# Patient Record
Sex: Male | Born: 1935 | Race: White | Hispanic: No | Smoking: Never smoker
Health system: Southern US, Community
[De-identification: ages and names within clinical notes are randomized; demographics above are authoritative.]

## PROBLEM LIST (undated history)

## (undated) DIAGNOSIS — E785 Hyperlipidemia, unspecified: Secondary | ICD-10-CM

## (undated) DIAGNOSIS — N189 Chronic kidney disease, unspecified: Secondary | ICD-10-CM

## (undated) DIAGNOSIS — F0280 Dementia in other diseases classified elsewhere without behavioral disturbance: Secondary | ICD-10-CM

## (undated) DIAGNOSIS — I10 Essential (primary) hypertension: Secondary | ICD-10-CM

## (undated) DIAGNOSIS — S72009A Fracture of unspecified part of neck of unspecified femur, initial encounter for closed fracture: Secondary | ICD-10-CM

## (undated) DIAGNOSIS — G459 Transient cerebral ischemic attack, unspecified: Secondary | ICD-10-CM

## (undated) DIAGNOSIS — G3183 Dementia with Lewy bodies: Secondary | ICD-10-CM

## (undated) DIAGNOSIS — G20 Parkinson's disease: Secondary | ICD-10-CM

## (undated) HISTORY — PX: OTHER SURGICAL HISTORY: SHX169

---

## 2004-08-06 ENCOUNTER — Ambulatory Visit: Payer: Self-pay | Admitting: Family Medicine

## 2004-12-03 ENCOUNTER — Ambulatory Visit: Payer: Self-pay | Admitting: General Surgery

## 2007-05-12 ENCOUNTER — Encounter: Admission: RE | Admit: 2007-05-12 | Discharge: 2007-08-18 | Payer: Self-pay | Admitting: Neurology

## 2007-09-05 ENCOUNTER — Encounter: Admission: RE | Admit: 2007-09-05 | Discharge: 2007-09-05 | Payer: Self-pay | Admitting: Neurology

## 2008-09-17 ENCOUNTER — Emergency Department: Payer: Self-pay | Admitting: Unknown Physician Specialty

## 2009-08-08 ENCOUNTER — Ambulatory Visit: Payer: Self-pay | Admitting: Internal Medicine

## 2009-08-08 DIAGNOSIS — E785 Hyperlipidemia, unspecified: Secondary | ICD-10-CM

## 2009-08-08 DIAGNOSIS — I10 Essential (primary) hypertension: Secondary | ICD-10-CM | POA: Insufficient documentation

## 2009-08-08 DIAGNOSIS — R05 Cough: Secondary | ICD-10-CM

## 2009-08-09 ENCOUNTER — Telehealth (INDEPENDENT_AMBULATORY_CARE_PROVIDER_SITE_OTHER): Payer: Self-pay | Admitting: *Deleted

## 2009-08-28 ENCOUNTER — Telehealth (INDEPENDENT_AMBULATORY_CARE_PROVIDER_SITE_OTHER): Payer: Self-pay | Admitting: *Deleted

## 2009-08-29 ENCOUNTER — Ambulatory Visit: Payer: Self-pay | Admitting: Internal Medicine

## 2009-09-29 ENCOUNTER — Ambulatory Visit: Payer: Self-pay | Admitting: Internal Medicine

## 2009-11-07 ENCOUNTER — Telehealth (INDEPENDENT_AMBULATORY_CARE_PROVIDER_SITE_OTHER): Payer: Self-pay | Admitting: *Deleted

## 2009-11-29 ENCOUNTER — Ambulatory Visit: Payer: Self-pay | Admitting: Internal Medicine

## 2009-11-29 DIAGNOSIS — J449 Chronic obstructive pulmonary disease, unspecified: Secondary | ICD-10-CM

## 2009-11-29 DIAGNOSIS — J4489 Other specified chronic obstructive pulmonary disease: Secondary | ICD-10-CM | POA: Insufficient documentation

## 2010-02-21 NOTE — Progress Notes (Signed)
Summary: samples  Phone Note Call from Patient   Caller: Patient Call For: [facility] Summary of Call: pt had to rsc appt for pft/ rov because of bump list. he will need 3 more "containers" of bystolic (samples) before then. call [phone]. Initial call taken by: Tivis Ringer, CNA,  November 07, 2009 10:15 AM  Follow-up for Phone Call        3 boxes of bystolic 20mg  left out front for pt due to [place] moving his appt--lot # [id number]  exp-11/2010--pt aware and will p/u in about a week Follow-up by: Philipp Deputy CMA,  November 07, 2009 11:24 AM

## 2010-02-21 NOTE — Assessment & Plan Note (Signed)
Summary: Pulmonary/ ext f/u eval uacs/ bp ok off ace   Visit Type:  Follow-up Copy to:  Dr. Lorie Phenix Primary Provider/Referring Provider:  Dr. Lorie Phenix  CC:  Patient is here for follow-up...pt says his cough is gone but still wheezing and light-headed at times.  History of Present Illness: 77 yowm quit smoking 06/20/67 because friend died from lung cancer but pt had no symptoms  August 08, 2009  1st pulmonary office eval for noisy breathing comes and goes for years (maybe since the age of 54 or so) worse x 6 months  with minimal assoc coughing minimal prod yellow mucus production but no effect on ex tol which  is quite good. no noct disturbances.  rec try off lisnopril > no better  August 29, 2009 Followup.  Pt states still wheezing "comes and goes"- notices most first thing in the am. Pt states that he feels dizzy shortly after taking benicar.  took whole pill this am. no limiting sob, cough or noct or am exac.  stop metaprolol Stop benicar Stop fish oil  Start bystolic 20 mg one daily x 4 weeks trial Prilosec start taking it one daily 30 min before bfast  PFT's  September 29, 2009 Patient is here for follow-up...pt says his cough is gone but still wheezing and light-headed at times.  wokrs in the yard in heat , walks 1.5 miles on a treadmill daily without problems. was having dizzy spells before med changes, no worse now, not reproducible with position or activity, usually just last a second or two.  Pt denies any significant sore throat, dysphagia, itching, sneezing,  nasal congestion or excess secretions,  fever, chills, sweats, unintended wt loss, pleuritic or exertional cp, hempoptysis, change in activity tolerance  orthopnea pnd or leg swelling. Pt also denies any obvious fluctuation in symptoms with weather or environmental change or other alleviating or aggravating factors.       Current Medications (verified): 1)  Simvastatin 20 Mg Tabs (Simvastatin) .Marland Kitchen.. 1 Once Daily 2)   Juice Plus Fibre  Liqd (Nutritional Supplements) .... As Directed 3)  Aspirin 81 Mg Tbec (Aspirin) .Marland Kitchen.. 1 Once Daily 4)  Prilosec Otc 20 Mg Tbec (Omeprazole Magnesium) .Marland Kitchen.. 1 By Mouth Daily 5)  Multivitamins  Tabs (Multiple Vitamin) .Marland Kitchen.. 1 Once Daily 6)  Bystolic 20 Mg Tabs (Nebivolol Hcl) .... One Daily  Allergies (verified): No Known Drug Allergies  Past History:  Past Medical History: Hyperlipidemia Hypertension     - Off ace August 08, 2009 due to "noisy breathing" > no better August 29, 2009 > resolved September 29, 2009      - Try change to bystolic August 29, 2009   Vital Signs:  Patient profile:   77 year old male Height:      69 inches (175.26 cm) Weight:      210 pounds (95.45 kg) BMI:     31.12 O2 Sat:      94 % on Room air Temp:     98.2 degrees F (36.78 degrees C) oral Pulse rate:   60 / minute BP sitting:   140 / 82  (left arm) BP standing:   122 / 78  (left arm) Cuff size:   regular  Vitals Entered By: Michel Bickers CMA (September 29, 2009 2:06 PM)  O2 Sat at Rest %:  94 O2 Flow:  Room air CC: Patient is here for follow-up...pt says his cough is gone but still wheezing and  light-headed at times Comments Medications reviewed with the patient. Daytime phone verified. Michel Bickers [facility]  September 29, 2009 2:07 PM   Physical Exam  Additional Exam:  amb wm nad no longer with classic  pseudowheeze  wt 210 August 08, 2009>  208 August 29, 2009 > 210 September 29, 2009  HEENT: nl dentition, turbinates, and orophanx. Nl external ear canals without cough reflex NECK :  without JVD/Nodes/TM/ nl carotid upstrokes bilaterally LUNGS: no acc muscle use, clear to A and P bilaterally without cough on insp or exp maneuvers CV:  RRR  no s3 or murmur or increase in P2, no edema   ABD:  soft and nontender with nl excursion in the supine position. No bruits or organomegaly, bowel sounds nl MS:  warm without deformities, calf tenderness, cyanosis or clubbing     Clinical Reports  Reviewed:  CXR:  08/08/2009: CXR Results:  Comparison: None   Findings: Cardiomediastinal silhouette is within normal limits. The lungs are free of focal consolidations and pleural effusions. There is minimal atelectasis at the right lung base. Bony structures have a normal appearance.   IMPRESSION: No evidence for acute cardiopulmonary abnormality.   Impression & Recommendations:  Problem # 1:  COUGH (ICD-786.2)  Resolved upper airway cough syndrome - needs baseline pft's to exclude copd / asthma before making a final rec re Beta blocker use  Orders: Est. Patient Level IV (62130)  Problem # 2:  HYPERTENSION, BENIGN (ICD-401.1)  His updated medication list for this problem includes:    Bystolic 20 Mg Tabs (Nebivolol hcl) ..... One daily  ok on Bystolic, the most beta -1  selective Beta blocker available in sample form, with bisoprolol the most selective generic choice  on the market. doubt this is cause of dizzy spells ? significant, no correlation with his bp per his diary ? does he need holter? defer to Dr Elease Hashimoto  Orders: Est. Patient Level IV (86578)  Medications Added to Medication List This Visit: 1)  Prilosec Otc 20 Mg Tbec (Omeprazole magnesium) .Marland Kitchen.. 1 by mouth daily 2)  Prilosec Otc 20 Mg Tbec (Omeprazole magnesium) .... Take  one 30-60 min before first meal of the day  Patient Instructions: 1)  Please schedule a follow-up appointment in 6 weeks, sooner if needed with PFT's on return

## 2010-02-21 NOTE — Assessment & Plan Note (Signed)
Summary: Pulmonary/  f/u change to bystolic 20    Copy to:  Dr. Lorie Phenix Primary Provider/Referring Provider:  Dr. Lorie Phenix  CC:  Followup.  Pt states still wheezing "comes and goes"- notices most first thing in the am. Pt states that he feels dizzy shortly after taking benicar. Marland Kitchen  History of Present Illness: 70 yowm quit smoking Jun 11, 1967 because friend died from lung cancer but pt had no symptoms  August 08, 2009  1st pulmonary office eval for noisy breathing comes and goes for years (maybe since the age of 63 or so) worse x 6 months  with minimal assoc coughing minimal prod yellow mucus production but no effect on ex tol whichi is quite good. no noct disturbances.  rec try off lisnopril > no better  August 29, 2009 Followup.  Pt states still wheezing "comes and goes"- notices most first thing in the am. Pt states that he feels dizzy shortly after taking benicar.  took whole pill this am. no limiting sob, cough or noct or am exac.  Pt denies any significant sore throat, dysphagia, itching, sneezing,  nasal congestion or excess secretions,  fever, chills, sweats, unintended wt loss, pleuritic or exertional cp, hempoptysis, change in activity tolerance  orthopnea pnd or leg swelling   Current Medications (verified): 1)  Metoprolol Tartrate 50 Mg Tabs (Metoprolol Tartrate) .Marland Kitchen.. 1 Once Daily 2)  Simvastatin 20 Mg Tabs (Simvastatin) .Marland Kitchen.. 1 Once Daily 3)  Juice Plus Fibre  Liqd (Nutritional Supplements) .... As Directed 4)  Aspirin 81 Mg Tbec (Aspirin) .Marland Kitchen.. 1 Once Daily 5)  Fish Oil 1200 Mg Caps (Omega-3 Fatty Acids) .Marland Kitchen.. 1 Once Daily 6)  Prilosec Otc 20 Mg Tbec (Omeprazole Magnesium) .Marland Kitchen.. 1 Every Other Day 7)  Multivitamins  Tabs (Multiple Vitamin) .Marland Kitchen.. 1 Once Daily 8)  Benicar 20 Mg  Tabs (Olmesartan Medoxomil) .... One Tablet By Mouth Daily  Allergies (verified): No Known Drug Allergies  Past History:  Past Medical History: Hyperlipidemia Hypertension     - Off ace August 08, 2009  due to "noisy breathing" > no better August 29, 2009      - Try change to bystolic August 29, 2009   Vital Signs:  Patient profile:   77 year old male Weight:      208 pounds O2 Sat:      97 % on Room air Temp:     97.6 degrees F oral Pulse rate:   59 / minute BP sitting:   144 / 86  (left arm)  Vitals Entered By: Vernie Murders (August 29, 2009 10:23 AM)  O2 Flow:  Room air  Physical Exam  Additional Exam:  amb wm nad with classic mild pseudowheeze resolves with purse lip maneuver  wt 210 August 08, 2009 . 208 August 29, 2009  HEENT: nl dentition, turbinates, and orophanx. Nl external ear canals without cough reflex NECK :  without JVD/Nodes/TM/ nl carotid upstrokes bilaterally LUNGS: no acc muscle use, clear to A and P bilaterally without cough on insp or exp maneuvers CV:  RRR  no s3 or murmur or increase in P2, no edema   ABD:  soft and nontender with nl excursion in the supine position. No bruits or organomegaly, bowel sounds nl MS:  warm without deformities, calf tenderness, cyanosis or clubbing     Clinical Reports Reviewed:  CXR:  08/08/2009: CXR Results:  Comparison: None   Findings: Cardiomediastinal silhouette is within normal limits. The lungs are free  of focal consolidations and pleural effusions. There is minimal atelectasis at the right lung base. Bony structures have a normal appearance.   IMPRESSION: No evidence for acute cardiopulmonary abnormality.   Impression & Recommendations:  Problem # 1:  COUGH (ICD-786.2)   Classic Upper airway cough syndrome, so named because it's frequently impossible to sort out how much is  CR/sinusitis with freq throat clearing (which can be related to primary GERD)   vs  causing  secondary extra esophageal GERD from wide swings in gastric pressure that occur with throat clearing, promoting self use of mint and menthol lozenges that reduce the lower esophageal sphincter tone and exacerbate the problem further These are the  same pts who not infrequently have failed to tolerate ace inhibitors,  dry powder inhalers or biphosphonates or report having reflux symptoms that don't respond to standard doses of PPI  Try maintain off ACE and on ppi/diet plus trial of Bystolic, the most beta -1  selective Beta blocker available in sample form, with bisoprolol the most selective generic choice  on the market.    Each maintenance medication was reviewed in detail including most importantly the difference between maintenance and as needed and under what circumstances the prns are to be used. See instructions for specific recommendations   Orders: Est. Patient Level III (47829)  Problem # 2:  HYPERTENSION, BENIGN (ICD-401.1)  The following medications were removed from the medication list:    Metoprolol Tartrate 50 Mg Tabs (Metoprolol tartrate) .Marland Kitchen... 1 once daily    Benicar 20 Mg Tabs (Olmesartan medoxomil) ..... One tablet by mouth daily His updated medication list for this problem includes:    Bystolic 20 Mg Tabs (Nebivolol hcl) ..... One daily     Medications Added to Medication List This Visit: 1)  Prilosec Otc 20 Mg Tbec (Omeprazole magnesium) .... Take one 30-60 min before first and last meals of the day 2)  Bystolic 20 Mg Tabs (Nebivolol hcl) .... One daily  Patient Instructions: 1)  stop metaprolol 2)  Stop benicar 3)  Stop fish oil  4)  Start bystolic 20 mg one daily x 4 weeks trial 5)  Prilosec start taking it one daily 30 min before bfast  6)  GERD (REFLUX)  is a common cause of respiratory symptoms. It commonly presents without heartburn and can be treated with medication, but also with lifestyle changes including avoidance of late meals, excessive alcohol, smoking cessation, and avoid fatty foods, chocolate, peppermint, colas, red wine, and acidic juices such as orange juice. NO MINT OR MENTHOL PRODUCTS SO NO COUGH DROPS  7)  USE SUGARLESS CANDY INSTEAD (jolley ranchers)  8)  NO OIL BASED VITAMINS  9)   Please schedule a follow-up appointment in 4 weeks, sooner if needed with PFT's

## 2010-02-21 NOTE — Progress Notes (Signed)
Summary: med  Phone Note Call from Patient Call back at [phone]   Caller: Patient Call For: wert Reason for Call: Talk to Nurse, Talk to Doctor Summary of Call: want to speak to dr. re: meds he's taking.  MW asked him to call him back on medicine. Initial call taken by: Eugene Gavia,  August 28, 2009 2:08 PM  Follow-up for Phone Call        Spoke with pt.  He states that he has been taking benicar in place of lisinopril and breathing is no better.  Also c/o feeling lightheaded on benicar.  Appt with MW for 10:20 tommorrow.  Follow-up by: Vernie Murders,  August 28, 2009 2:46 PM

## 2010-02-21 NOTE — Assessment & Plan Note (Signed)
Summary: Pulmonary new pt eval for noisy breathing/ ? all ace?    Visit Type:  Initial Consult Copy to:  Dr. Lorie Phenix Primary Provider/Referring Provider:  Dr. Lorie Phenix  CC:  Wheezing.  History of Present Illness: 55 yowm quit smoking 1967/06/04 because friend died from lung cancer but pt had no symptoms  August 08, 2009  1st pulmonary office eval for noisy breathing comes and goes for years (maybe since the age of 33 or so) with minimal assoc coughing minimal prod yellow mucus production but no effect on ex tol whichi is quite good. no noct disturbances. Pt denies any significant sore throat, dysphagia, itching, sneezing,  nasal congestion or excess secretions,  fever, chills, sweats, unintended wt loss, pleuritic or exertional cp, hempoptysis, change in activity tolerance  orthopnea pnd or leg swelling Pt also denies any obvious fluctuation in symptoms with weather or environmental change or other alleviating or aggravating factors.       Current Medications (verified): 1)  Lisinopril 20 Mg Tabs (Lisinopril) .Marland Kitchen.. 1 Once Daily 2)  Metoprolol Tartrate 50 Mg Tabs (Metoprolol Tartrate) .Marland Kitchen.. 1 Once Daily 3)  Simvastatin 20 Mg Tabs (Simvastatin) .Marland Kitchen.. 1 Once Daily 4)  Juice Plus Fibre  Liqd (Nutritional Supplements) .... As Directed 5)  Aspirin 81 Mg Tbec (Aspirin) .Marland Kitchen.. 1 Once Daily 6)  Fish Oil 1200 Mg Caps (Omega-3 Fatty Acids) .Marland Kitchen.. 1 Once Daily 7)  Prilosec Otc 20 Mg Tbec (Omeprazole Magnesium) .Marland Kitchen.. 1 Every Other Day 8)  Multivitamins  Tabs (Multiple Vitamin) .Marland Kitchen.. 1 Once Daily  Allergies (verified): No Known Drug Allergies  Past History:  Past Medical History: Hyperlipidemia Hypertension     - Off ace August 08, 2009 due to "noisy breathing"  Past Surgical History: Cholecystectomy 1970-06-04  Family History: MI- Father Negative for respiratory diseases or atopy   Social History: Former smoker.  Quit in 04-Jun-1967.  Smoked approx 10 yrs up to 1/2 ppd. No  ETOH Divorced Children Retired from [employer]  Review of Systems       The patient complains of productive cough, non-productive cough, and change in color of mucus.  The patient denies shortness of breath with activity, shortness of breath at rest, coughing up blood, chest pain, irregular heartbeats, acid heartburn, indigestion, loss of appetite, weight change, abdominal pain, difficulty swallowing, sore throat, tooth/dental problems, headaches, nasal congestion/difficulty breathing through nose, sneezing, itching, ear ache, anxiety, depression, hand/feet swelling, joint stiffness or pain, rash, and fever.    Vital Signs:  Patient profile:   77 year old male Height:      69 inches Weight:      210 pounds BMI:     31.12 O2 Sat:      96 % on Room air Temp:     97.7 degrees F oral Pulse rate:   60 / minute BP sitting:   144 / 82  (left arm)  Vitals Entered By: Vernie Murders (August 08, 2009 2:54 PM)  O2 Flow:  Room air  Physical Exam  Additional Exam:  amb wm nad with classic mild pseudowheeze resolves with purse lip maneuver  wt 210 August 08, 2009 HEENT: nl dentition, turbinates, and orophanx. Nl external ear canals without cough reflex NECK :  without JVD/Nodes/TM/ nl carotid upstrokes bilaterally LUNGS: no acc muscle use, clear to A and P bilaterally without cough on insp or exp maneuvers CV:  RRR  no s3 or murmur or increase in P2, no edema   ABD:  soft and nontender  with nl excursion in the supine position. No bruits or organomegaly, bowel sounds nl MS:  warm without deformities, calf tenderness, cyanosis or clubbing SKIN: warm and dry without lesions   NEURO:  alert, approp, no deficits     CXR  Procedure date:  08/08/2009  Findings:      Comparison: None   Findings: Cardiomediastinal silhouette is within normal limits. The lungs are free of focal consolidations and pleural effusions. There is minimal atelectasis at the right lung base. Bony structures have a  normal appearance.   IMPRESSION: No evidence for acute cardiopulmonary abnormality.  Impression & Recommendations:  Problem # 1:  COUGH (ICD-786.2)  Classic Upper airway cough syndrome, so named because it's frequently impossible to sort out how much is  CR/sinusitis with freq throat clearing (which can be related to primary GERD)   vs  causing  secondary extra esophageal GERD from wide swings in gastric pressure that occur with throat clearing, promoting self use of mint and menthol lozenges that reduce the lower esophageal sphincter tone and exacerbate the problem further These are the same pts who not infrequently have failed to tolerate ace inhibitors,  dry powder inhalers or biphosphonates or report having reflux symptoms that don't respond to standard doses of PPI  Try off ace first and see if this helps and if so no furhter pulmonary eval needed  Problem # 2:  HYPERTENSION, BENIGN (ICD-401.1)    ACE inhibitors are problematic in  pts with airway complaints because  even experienced pulmonologists can't always distinguish ace effects from copd/asthma.  By themselves they don't actually cause a problem, much like oxygen can't by itself start a fire, but they certainly serve as a powerful catalyst or enhancer for any "fire"  or inflammatory process in the upper airway, be it caused by an ET  tube or more commonly reflux (especially in the obese or pts with known GERD or who are on biphoshonates).  In the era of ARB near equivalency until we have a better handle on the reversibility of the airway problem, it just makes sense to avoid ace entirely in the short run and then decide later, having established a level of airway control using a reasonable limited regimen, whether to add back ace but even then being very careful to observe the pt for worsening airway control and number of meds used/ needed to control symptoms.    Orders: New Patient 65&> (95621)  Medications Added to Medication List  This Visit: 1)  Lisinopril 20 Mg Tabs (Lisinopril) .Marland Kitchen.. 1 once daily 2)  Metoprolol Tartrate 50 Mg Tabs (Metoprolol tartrate) .Marland Kitchen.. 1 once daily 3)  Simvastatin 20 Mg Tabs (Simvastatin) .Marland Kitchen.. 1 once daily 4)  Juice Plus Fibre Liqd (Nutritional supplements) .... As directed 5)  Aspirin 81 Mg Tbec (Aspirin) .Marland Kitchen.. 1 once daily 6)  Fish Oil 1200 Mg Caps (Omega-3 fatty acids) .Marland Kitchen.. 1 once daily 7)  Prilosec Otc 20 Mg Tbec (Omeprazole magnesium) .Marland Kitchen.. 1 every other day 8)  Multivitamins Tabs (Multiple vitamin) .Marland Kitchen.. 1 once daily 9)  Benicar 20 Mg Tabs (Olmesartan medoxomil) .... One tablet by mouth daily  Other Orders: T-2 View CXR (71020TC)  Patient Instructions: 1)  stop lisinopril 2)  start benicar 20 mg one daily x 6 weeks and if  better let Dr Elease Hashimoto and Prachous 3)  Return here  in 6 weeks if not 100% satisfied that you are better

## 2010-02-21 NOTE — Miscellaneous (Signed)
Summary: Orders Update pft charges  Clinical Lists Changes  Orders: Added new Service order of Carbon Monoxide diffusing w/capacity (94720) - Signed Added new Service order of Lung Volumes (94240) - Signed Added new Service order of Spirometry (Pre & Post) (94060) - Signed 

## 2010-02-21 NOTE — Assessment & Plan Note (Signed)
Summary: Pulmonary/ ext summary f/u ov for asthmatic copd/ hfa 50%   Copy to:  Dr. Lorie Phenix Primary Rick [name]/Referring Joden Bonsall:  Dr. Lorie Phenix  CC:  Cough and wheezing- the same.  History of Present Illness: 21 yowm quit smoking 02-Jul-1967 because friend died from lung cancer but pt had no symptoms  August 08, 2009  1st pulmonary office eval for noisy breathing comes and goes for years (maybe since the age of 8 or so) worse x 6 months  with minimal assoc coughing minimal prod yellow mucus production but no effect on ex tol which  is quite good. no noct disturbances.  rec try off lisnopril > no better  August 29, 2009 Followup.  Pt states still wheezing "comes and goes"- notices most first thing in the am. Pt states that he feels dizzy shortly after taking benicar.  took whole pill this am. no limiting sob, cough or noct or am exac.  stop metaprolol Stop benicar Stop fish oil  Start bystolic 20 mg one daily x 4 weeks trial Prilosec start taking it one daily 30 min before bfast  PFT's  September 29, 2009 Patient is here for follow-up...pt says his cough is gone but still wheezing and light-headed at times.  works in the yard in heat , walks 1.5 miles on a treadmill daily without problems. was having dizzy spells before med changes, no worse now, not reproducible with position or activity, usually just last a second or two.   November 29, 2009 ov Cough has resolved but  wheezing- the same not limited in any way. wheezing comes and goes with no pattern but never wake up with it.  Pt denies any significant sore throat, dysphagia, itching, sneezing,  nasal congestion or excess secretions,  fever, chills, sweats, unintended wt loss, pleuritic or exertional cp, hempoptysis, change in activity tolerance  orthopnea pnd or leg swelling. Pt also denies any obvious fluctuation in symptoms with weather or environmental change or other alleviating or aggravating factors.         Current Medications  (verified): 1)  Simvastatin 20 Mg Tabs (Simvastatin) .Marland Kitchen.. 1 Once Daily 2)  Juice Plus Fibre  Liqd (Nutritional Supplements) .... As Directed 3)  Aspirin 81 Mg Tbec (Aspirin) .Marland Kitchen.. 1 Once Daily 4)  Prilosec Otc 20 Mg Tbec (Omeprazole Magnesium) .... Take  One 30-60 Min Before First Meal of The Day 5)  Multivitamins  Tabs (Multiple Vitamin) .Marland Kitchen.. 1 Once Daily 6)  Bystolic 20 Mg Tabs (Nebivolol Hcl) .... One Daily  Allergies (verified): No Known Drug Allergies  Past History:  Past Medical History: Hyperlipidemia Hypertension     - Off ace August 08, 2009 due to "noisy breathing" > no better August 29, 2009 > resolved September 29, 2009      - Try change to bystolic August 29, 2009  COPD      - PFT's November 29, 2009   FEV1 1.61 (58%) ratio 48 and 21% better p B2,  DLC0 115      - HFA 50% p coaching November 29, 2009   Vital Signs:  Patient profile:   77 year old male Weight:      211 pounds O2 Sat:      96 % on Room air Temp:     97.5 degrees F oral Pulse rate:   68 / minute BP sitting:   150 / 86  (left arm)  Vitals Entered By: Vernie Murders (November 29, 2009 9:49 AM)  O2 Flow:  Room air  Physical Exam  Additional Exam:  amb wm nad no longer with classic  pseudowheeze  wt 210 August 08, 2009>  208 August 29, 2009 > 210 September 29, 2009 > 211 November 29, 2009  HEENT: nl dentition, turbinates, and orophanx. Nl external ear canals without cough reflex NECK :  without JVD/Nodes/TM/ nl carotid upstrokes bilaterally LUNGS: no acc muscle use, clear to A and P bilaterally without cough on insp or exp maneuvers CV:  RRR  no s3 or murmur or increase in P2, no edema   ABD:  soft and nontender with nl excursion in the supine position. No bruits or organomegaly, bowel sounds nl MS:  warm without deformities, calf tenderness, cyanosis or clubbing     Impression & Recommendations:  Problem # 1:  COPD UNSPECIFIED (ICD-496) GOLD II with significant asthmatic component but minimal  symptoms - try symbicort 160 2 puffs first thing  in am and 2 puffs again in pm about 12 hours later to see if subjective wheezing improves or not - still concerned alot of these symptoms are actually upper airway   I spent extra time with the patient today explaining optimal mdi  technique.  This improved from  25 -50%  p coaching  Problem # 2:  HYPERTENSION, BENIGN (ICD-401.1)  His updated medication list for this problem includes:    Bystolic 20 Mg Tabs (Nebivolol hcl) ..... One daily  In view of degree of asthmatic component rec Bystolic, the most beta -1  selective Beta blocker available in sample form, with bisoprolol the most selective generic choice  on the market.   Medications Added to Medication List This Visit: 1)  Symbicort 160-4.5 Mcg/act Aero (Budesonide-formoterol fumarate) .... 2 puffs every 12 hours as needed  Other Orders: Est. Patient Level IV (16109)  Patient Instructions: 1)  Try symbicort 160 2 puffs every 12 hours if wheezing or short of breath, if you like it fill the prescripition 2)  Work on inhaler technique:  relax and blow all the way out then take a nice smooth deep breath back in, triggering the inhaler at same time you start breathing in  3)  Please schedule a follow-up appointment as needed. Prescriptions: SYMBICORT 160-4.5 MCG/ACT  AERO (BUDESONIDE-FORMOTEROL FUMARATE) 2 puffs every 12 hours as needed  #1 x 11   Entered and Authorized by:   Nyoka Cowden MD   Signed by:   Nyoka Cowden MD on 11/29/2009   Method used:   Print then Mail to Patient   RxID:   [id number] BYSTOLIC 20 MG TABS (NEBIVOLOL HCL) one daily  #30 x 11   Entered and Authorized by:   Nyoka Cowden MD   Signed by:   Nyoka Cowden MD on 11/29/2009   Method used:   Print then Mail to Patient   RxID:   [id number]

## 2010-02-21 NOTE — Progress Notes (Signed)
Summary: returned call  Phone Note Call from Patient   Caller: Patient Call For: wert Summary of Call: pt returned call from leslie. call [phone] Initial call taken by: Tivis Ringer, CNA,  August 09, 2009 12:51 PM  Follow-up for Phone Call        Pt aware of results.Reynaldo Minium CMA  August 09, 2009 1:52 PM

## 2010-03-21 ENCOUNTER — Observation Stay: Payer: Self-pay | Admitting: Internal Medicine

## 2010-03-26 ENCOUNTER — Ambulatory Visit: Payer: Self-pay | Admitting: Family Medicine

## 2011-05-31 ENCOUNTER — Observation Stay: Payer: Self-pay | Admitting: Student

## 2011-05-31 LAB — COMPREHENSIVE METABOLIC PANEL
Bilirubin,Total: 0.7 mg/dL (ref 0.2–1.0)
Calcium, Total: 8.4 mg/dL — ABNORMAL LOW (ref 8.5–10.1)
Co2: 31 mmol/L (ref 21–32)
Creatinine: 1.39 mg/dL — ABNORMAL HIGH (ref 0.60–1.30)
Potassium: 4.5 mmol/L (ref 3.5–5.1)
SGPT (ALT): 19 U/L
Total Protein: 7 g/dL (ref 6.4–8.2)

## 2011-05-31 LAB — URINALYSIS, COMPLETE
Bacteria: NONE SEEN
Ketone: NEGATIVE
Nitrite: NEGATIVE
Ph: 7 (ref 4.5–8.0)
RBC,UR: 1 /HPF (ref 0–5)
Squamous Epithelial: <1
WBC UR: <1 /HPF (ref 0–5)

## 2011-05-31 LAB — TSH: Thyroid Stimulating Horm: 1.55 u[IU]/mL

## 2011-05-31 LAB — CBC
Platelet: 218 10*3/uL (ref 150–440)
WBC: 7.3 10*3/uL (ref 3.8–10.6)

## 2011-06-01 LAB — LIPID PANEL
Cholesterol: 156 mg/dL (ref 0–200)
HDL Cholesterol: 36 mg/dL — ABNORMAL LOW (ref 40–60)
Ldl Cholesterol, Calc: 104 mg/dL — ABNORMAL HIGH (ref 0–100)
Triglycerides: 80 mg/dL (ref 0–200)

## 2011-06-01 LAB — CBC WITH DIFFERENTIAL/PLATELET
Basophil %: 0.2 %
HCT: 41.3 % (ref 40.0–52.0)
HGB: 14.2 g/dL (ref 13.0–18.0)
Lymphocyte #: 0.8 10*3/uL — ABNORMAL LOW (ref 1.0–3.6)
MCHC: 34.3 g/dL (ref 32.0–36.0)
Neutrophil #: 9.1 10*3/uL — ABNORMAL HIGH (ref 1.4–6.5)
Neutrophil %: 88.1 %
RBC: 4.62 10*6/uL (ref 4.40–5.90)

## 2011-06-01 LAB — BASIC METABOLIC PANEL
BUN: 15 mg/dL (ref 7–18)
Calcium, Total: 8.2 mg/dL — ABNORMAL LOW (ref 8.5–10.1)
Chloride: 102 mmol/L (ref 98–107)
Co2: 27 mmol/L (ref 21–32)
Potassium: 4 mmol/L (ref 3.5–5.1)
Sodium: 138 mmol/L (ref 136–145)

## 2011-06-01 LAB — HEMOGLOBIN A1C: Hemoglobin A1C: 4.8 % (ref 4.2–6.3)

## 2011-07-24 ENCOUNTER — Other Ambulatory Visit: Payer: Self-pay | Admitting: Neurology

## 2011-07-24 DIAGNOSIS — I69359 Hemiplegia and hemiparesis following cerebral infarction affecting unspecified side: Secondary | ICD-10-CM

## 2011-08-02 ENCOUNTER — Ambulatory Visit
Admission: RE | Admit: 2011-08-02 | Discharge: 2011-08-02 | Disposition: A | Discharge status: Home / Self Care | Payer: Medicare Other | Source: Ambulatory Visit | Attending: Neurology | Admitting: Neurology

## 2011-08-02 DIAGNOSIS — I69359 Hemiplegia and hemiparesis following cerebral infarction affecting unspecified side: Secondary | ICD-10-CM

## 2011-08-07 ENCOUNTER — Other Ambulatory Visit: Payer: Self-pay | Admitting: Neurology

## 2011-08-07 DIAGNOSIS — I639 Cerebral infarction, unspecified: Secondary | ICD-10-CM

## 2011-08-14 ENCOUNTER — Ambulatory Visit
Admission: RE | Admit: 2011-08-14 | Discharge: 2011-08-14 | Disposition: A | Discharge status: Home / Self Care | Payer: Medicare Other | Source: Ambulatory Visit | Attending: Neurology | Admitting: Neurology

## 2011-08-14 DIAGNOSIS — I639 Cerebral infarction, unspecified: Secondary | ICD-10-CM

## 2011-08-14 MED ORDER — IOHEXOL 350 MG/ML SOLN
100.0000 mL | Freq: Once | INTRAVENOUS | Status: AC | PRN
  Administered 2011-08-14: 100 mL via INTRAVENOUS

## 2012-02-05 ENCOUNTER — Ambulatory Visit: Payer: Self-pay | Admitting: Family Medicine

## 2012-05-13 ENCOUNTER — Other Ambulatory Visit: Payer: Self-pay

## 2012-05-13 MED ORDER — DONEPEZIL HCL 10 MG PO TABS: 10.0000 mg | ORAL_TABLET | Freq: Every day | ORAL | Status: DC

## 2012-05-13 NOTE — Telephone Encounter (Signed)
Former [facility] patient.  Has not been assigned ned MD.  Berkley Harvey refills via [facility]

## 2012-05-25 ENCOUNTER — Emergency Department: Payer: Self-pay | Admitting: Emergency Medicine

## 2012-05-25 LAB — URINALYSIS, COMPLETE
Glucose,UR: NEGATIVE mg/dL (ref 0–75)
Ketone: NEGATIVE
Nitrite: NEGATIVE
Protein: NEGATIVE
Specific Gravity: 1.008 (ref 1.003–1.030)
Squamous Epithelial: NONE SEEN

## 2012-05-25 LAB — COMPREHENSIVE METABOLIC PANEL
Alkaline Phosphatase: 80 U/L (ref 50–136)
Anion Gap: 5 — ABNORMAL LOW (ref 7–16)
Bilirubin,Total: 0.5 mg/dL (ref 0.2–1.0)
Chloride: 108 mmol/L — ABNORMAL HIGH (ref 98–107)
Co2: 28 mmol/L (ref 21–32)
Creatinine: 1.49 mg/dL — ABNORMAL HIGH (ref 0.60–1.30)
EGFR (African American): 51 — ABNORMAL LOW
EGFR (Non-African Amer.): 44 — ABNORMAL LOW
Glucose: 86 mg/dL (ref 65–99)
Potassium: 4.2 mmol/L (ref 3.5–5.1)
SGOT(AST): 23 U/L (ref 15–37)
SGPT (ALT): 17 U/L (ref 12–78)
Sodium: 141 mmol/L (ref 136–145)
Total Protein: 6.7 g/dL (ref 6.4–8.2)

## 2012-05-25 LAB — CBC WITH DIFFERENTIAL/PLATELET
Basophil #: 0.1 10*3/uL (ref 0.0–0.1)
Basophil %: 1.1 %
Eosinophil #: 0.3 10*3/uL (ref 0.0–0.7)
Eosinophil %: 3.6 %
HCT: 36.9 % — ABNORMAL LOW (ref 40.0–52.0)
HGB: 13 g/dL (ref 13.0–18.0)
Lymphocyte #: 1.6 10*3/uL (ref 1.0–3.6)
MCHC: 35.2 g/dL (ref 32.0–36.0)
Monocyte #: 0.6 x10 3/mm (ref 0.2–1.0)
Monocyte %: 7.1 %
Neutrophil #: 5.5 10*3/uL (ref 1.4–6.5)
Platelet: 239 10*3/uL (ref 150–440)
WBC: 8 10*3/uL (ref 3.8–10.6)

## 2012-05-25 LAB — CK TOTAL AND CKMB (NOT AT ARMC): CK, Total: 289 U/L — ABNORMAL HIGH (ref 35–232)

## 2012-06-26 ENCOUNTER — Emergency Department: Payer: Self-pay | Admitting: Emergency Medicine

## 2012-06-26 LAB — BASIC METABOLIC PANEL
Anion Gap: 5 — ABNORMAL LOW (ref 7–16)
Chloride: 106 mmol/L (ref 98–107)
Creatinine: 1.46 mg/dL — ABNORMAL HIGH (ref 0.60–1.30)
EGFR (African American): 53 — ABNORMAL LOW
Glucose: 89 mg/dL (ref 65–99)
Osmolality: 283 (ref 275–301)
Potassium: 3.8 mmol/L (ref 3.5–5.1)
Sodium: 141 mmol/L (ref 136–145)

## 2012-06-26 LAB — CBC
HGB: 14.6 g/dL (ref 13.0–18.0)
MCH: 31.5 pg (ref 26.0–34.0)
MCV: 91 fL (ref 80–100)
Platelet: 235 10*3/uL (ref 150–440)
RDW: 12.8 % (ref 11.5–14.5)
WBC: 8.1 10*3/uL (ref 3.8–10.6)

## 2012-10-21 ENCOUNTER — Emergency Department: Payer: Self-pay | Admitting: Emergency Medicine

## 2012-10-21 LAB — CBC
HCT: 42 % (ref 40.0–52.0)
Platelet: 213 10*3/uL (ref 150–440)
RBC: 4.58 10*6/uL (ref 4.40–5.90)
WBC: 8.8 10*3/uL (ref 3.8–10.6)

## 2012-10-21 LAB — COMPREHENSIVE METABOLIC PANEL
Albumin: 3.5 g/dL (ref 3.4–5.0)
BUN: 16 mg/dL (ref 7–18)
Calcium, Total: 8.4 mg/dL — ABNORMAL LOW (ref 8.5–10.1)
Chloride: 104 mmol/L (ref 98–107)
EGFR (African American): 49 — ABNORMAL LOW
EGFR (Non-African Amer.): 42 — ABNORMAL LOW
Glucose: 111 mg/dL — ABNORMAL HIGH (ref 65–99)
Potassium: 3.9 mmol/L (ref 3.5–5.1)
SGOT(AST): 27 U/L (ref 15–37)
SGPT (ALT): 19 U/L (ref 12–78)
Sodium: 136 mmol/L (ref 136–145)
Total Protein: 6.8 g/dL (ref 6.4–8.2)

## 2012-10-21 LAB — URINALYSIS, COMPLETE
Bacteria: NONE SEEN
Ketone: NEGATIVE
Leukocyte Esterase: NEGATIVE
Nitrite: NEGATIVE
Ph: 5 (ref 4.5–8.0)
Protein: NEGATIVE
RBC,UR: 1 /HPF (ref 0–5)
Specific Gravity: 1.01 (ref 1.003–1.030)

## 2012-10-21 LAB — CK TOTAL AND CKMB (NOT AT ARMC): CK-MB: 4.7 ng/mL — ABNORMAL HIGH (ref 0.5–3.6)

## 2012-11-10 ENCOUNTER — Other Ambulatory Visit: Payer: Self-pay | Admitting: Neurology

## 2013-01-18 ENCOUNTER — Ambulatory Visit: Payer: Self-pay | Admitting: Neurology

## 2013-03-02 ENCOUNTER — Encounter: Payer: Self-pay | Admitting: Neurology

## 2013-03-21 ENCOUNTER — Encounter: Payer: Self-pay | Admitting: Neurology

## 2013-04-21 ENCOUNTER — Encounter: Payer: Self-pay | Admitting: Neurology

## 2013-05-21 ENCOUNTER — Encounter: Payer: Self-pay | Admitting: Neurology

## 2013-09-21 ENCOUNTER — Ambulatory Visit: Payer: Self-pay | Admitting: Internal Medicine

## 2013-09-30 ENCOUNTER — Inpatient Hospital Stay: Payer: Self-pay | Admitting: Internal Medicine

## 2013-09-30 LAB — URINALYSIS, COMPLETE
BILIRUBIN, UR: NEGATIVE
BLOOD: NEGATIVE
Bacteria: NONE SEEN
Glucose,UR: NEGATIVE mg/dL (ref 0–75)
NITRITE: NEGATIVE
PH: 6 (ref 4.5–8.0)
SPECIFIC GRAVITY: 1.02 (ref 1.003–1.030)
WBC UR: 24 /HPF (ref 0–5)

## 2013-09-30 LAB — COMPREHENSIVE METABOLIC PANEL
ANION GAP: 7 (ref 7–16)
Albumin: 3.2 g/dL — ABNORMAL LOW (ref 3.4–5.0)
Alkaline Phosphatase: 79 U/L
BILIRUBIN TOTAL: 1.4 mg/dL — AB (ref 0.2–1.0)
BUN: 20 mg/dL — AB (ref 7–18)
CALCIUM: 8.3 mg/dL — AB (ref 8.5–10.1)
CREATININE: 1.41 mg/dL — AB (ref 0.60–1.30)
Chloride: 102 mmol/L (ref 98–107)
Co2: 27 mmol/L (ref 21–32)
EGFR (Non-African Amer.): 47 — ABNORMAL LOW
GFR CALC AF AMER: 55 — AB
Glucose: 107 mg/dL — ABNORMAL HIGH (ref 65–99)
OSMOLALITY: 275 (ref 275–301)
POTASSIUM: 4 mmol/L (ref 3.5–5.1)
SGOT(AST): 31 U/L (ref 15–37)
SGPT (ALT): 21 U/L
Sodium: 136 mmol/L (ref 136–145)
TOTAL PROTEIN: 7.1 g/dL (ref 6.4–8.2)

## 2013-09-30 LAB — CBC
HCT: 41.7 % (ref 40.0–52.0)
HGB: 14 g/dL (ref 13.0–18.0)
MCH: 30.3 pg (ref 26.0–34.0)
MCHC: 33.6 g/dL (ref 32.0–36.0)
MCV: 90 fL (ref 80–100)
Platelet: 200 10*3/uL (ref 150–440)
RBC: 4.63 10*6/uL (ref 4.40–5.90)
RDW: 12.7 % (ref 11.5–14.5)
WBC: 15.2 10*3/uL — ABNORMAL HIGH (ref 3.8–10.6)

## 2013-09-30 LAB — TROPONIN I: Troponin-I: <0.02 ng/mL

## 2013-09-30 LAB — APTT: ACTIVATED PTT: 30.6 s (ref 23.6–35.9)

## 2013-09-30 LAB — CK TOTAL AND CKMB (NOT AT ARMC)
CK, Total: 364 U/L — ABNORMAL HIGH
CK-MB: 2.1 ng/mL (ref 0.5–3.6)

## 2013-09-30 LAB — PROTIME-INR
INR: 1.1
Prothrombin Time: 14.2 secs (ref 11.5–14.7)

## 2013-10-01 LAB — BASIC METABOLIC PANEL
Anion Gap: 8 (ref 7–16)
BUN: 25 mg/dL — ABNORMAL HIGH (ref 7–18)
CALCIUM: 8.1 mg/dL — AB (ref 8.5–10.1)
CHLORIDE: 106 mmol/L (ref 98–107)
CREATININE: 1.53 mg/dL — AB (ref 0.60–1.30)
Co2: 23 mmol/L (ref 21–32)
EGFR (African American): 49 — ABNORMAL LOW
EGFR (Non-African Amer.): 43 — ABNORMAL LOW
GLUCOSE: 90 mg/dL (ref 65–99)
OSMOLALITY: 278 (ref 275–301)
POTASSIUM: 4.2 mmol/L (ref 3.5–5.1)
SODIUM: 137 mmol/L (ref 136–145)

## 2013-10-01 LAB — CBC WITH DIFFERENTIAL/PLATELET
BASOS ABS: 0.1 10*3/uL (ref 0.0–0.1)
Basophil %: 0.9 %
Eosinophil #: 0.5 10*3/uL (ref 0.0–0.7)
Eosinophil %: 4.8 %
HCT: 40.8 % (ref 40.0–52.0)
HGB: 13.6 g/dL (ref 13.0–18.0)
LYMPHS ABS: 1.2 10*3/uL (ref 1.0–3.6)
Lymphocyte %: 12.5 %
MCH: 30.4 pg (ref 26.0–34.0)
MCHC: 33.4 g/dL (ref 32.0–36.0)
MCV: 91 fL (ref 80–100)
MONO ABS: 0.6 x10 3/mm (ref 0.2–1.0)
Monocyte %: 6.2 %
NEUTROS ABS: 7.3 10*3/uL — AB (ref 1.4–6.5)
Neutrophil %: 75.6 %
Platelet: 160 10*3/uL (ref 150–440)
RBC: 4.48 10*6/uL (ref 4.40–5.90)
RDW: 13 % (ref 11.5–14.5)
WBC: 9.6 10*3/uL (ref 3.8–10.6)

## 2013-10-02 LAB — BASIC METABOLIC PANEL
ANION GAP: 12 (ref 7–16)
BUN: 23 mg/dL — ABNORMAL HIGH (ref 7–18)
CALCIUM: 7.7 mg/dL — AB (ref 8.5–10.1)
CO2: 20 mmol/L — AB (ref 21–32)
CREATININE: 1.19 mg/dL (ref 0.60–1.30)
Chloride: 108 mmol/L — ABNORMAL HIGH (ref 98–107)
EGFR (Non-African Amer.): 58 — ABNORMAL LOW
GLUCOSE: 89 mg/dL (ref 65–99)
Osmolality: 283 (ref 275–301)
Potassium: 3.9 mmol/L (ref 3.5–5.1)
SODIUM: 140 mmol/L (ref 136–145)

## 2013-10-02 LAB — CBC WITH DIFFERENTIAL/PLATELET
BASOS ABS: 0.1 10*3/uL (ref 0.0–0.1)
Basophil %: 0.7 %
EOS ABS: 0.7 10*3/uL (ref 0.0–0.7)
Eosinophil %: 6.6 %
HCT: 39.2 % — AB (ref 40.0–52.0)
HGB: 13.7 g/dL (ref 13.0–18.0)
LYMPHS PCT: 8.3 %
Lymphocyte #: 0.8 10*3/uL — ABNORMAL LOW (ref 1.0–3.6)
MCH: 31.1 pg (ref 26.0–34.0)
MCHC: 35 g/dL (ref 32.0–36.0)
MCV: 89 fL (ref 80–100)
MONO ABS: 0.7 x10 3/mm (ref 0.2–1.0)
MONOS PCT: 6.8 %
NEUTROS ABS: 7.7 10*3/uL — AB (ref 1.4–6.5)
Neutrophil %: 77.6 %
Platelet: 156 10*3/uL (ref 150–440)
RBC: 4.41 10*6/uL (ref 4.40–5.90)
RDW: 12.7 % (ref 11.5–14.5)
WBC: 9.9 10*3/uL (ref 3.8–10.6)

## 2013-10-02 LAB — URINE CULTURE

## 2013-10-03 LAB — BASIC METABOLIC PANEL
Anion Gap: 9 (ref 7–16)
BUN: 19 mg/dL — AB (ref 7–18)
CALCIUM: 8.2 mg/dL — AB (ref 8.5–10.1)
CO2: 24 mmol/L (ref 21–32)
Chloride: 106 mmol/L (ref 98–107)
Creatinine: 1.26 mg/dL (ref 0.60–1.30)
EGFR (African American): >60
GFR CALC NON AF AMER: 54 — AB
GLUCOSE: 100 mg/dL — AB (ref 65–99)
Osmolality: 280 (ref 275–301)
POTASSIUM: 3.5 mmol/L (ref 3.5–5.1)
SODIUM: 139 mmol/L (ref 136–145)

## 2013-10-03 LAB — CBC WITH DIFFERENTIAL/PLATELET
Basophil #: 0.1 10*3/uL (ref 0.0–0.1)
Basophil %: 0.8 %
Eosinophil #: 0.5 10*3/uL (ref 0.0–0.7)
Eosinophil %: 5.8 %
HCT: 40.2 % (ref 40.0–52.0)
HGB: 13.8 g/dL (ref 13.0–18.0)
Lymphocyte #: 0.8 10*3/uL — ABNORMAL LOW (ref 1.0–3.6)
Lymphocyte %: 8.5 %
MCH: 30.7 pg (ref 26.0–34.0)
MCHC: 34.4 g/dL (ref 32.0–36.0)
MCV: 89 fL (ref 80–100)
Monocyte #: 0.8 x10 3/mm (ref 0.2–1.0)
Monocyte %: 8.3 %
NEUTROS PCT: 76.6 %
Neutrophil #: 7.1 10*3/uL — ABNORMAL HIGH (ref 1.4–6.5)
PLATELETS: 178 10*3/uL (ref 150–440)
RBC: 4.5 10*6/uL (ref 4.40–5.90)
RDW: 12.7 % (ref 11.5–14.5)
WBC: 9.3 10*3/uL (ref 3.8–10.6)

## 2013-10-04 LAB — BASIC METABOLIC PANEL
ANION GAP: 12 (ref 7–16)
BUN: 26 mg/dL — ABNORMAL HIGH (ref 7–18)
CALCIUM: 7.5 mg/dL — AB (ref 8.5–10.1)
CHLORIDE: 106 mmol/L (ref 98–107)
CREATININE: 1.63 mg/dL — AB (ref 0.60–1.30)
Co2: 22 mmol/L (ref 21–32)
EGFR (African American): 46 — ABNORMAL LOW
GFR CALC NON AF AMER: 39 — AB
GLUCOSE: 118 mg/dL — AB (ref 65–99)
Osmolality: 285 (ref 275–301)
POTASSIUM: 4 mmol/L (ref 3.5–5.1)
Sodium: 140 mmol/L (ref 136–145)

## 2013-10-04 LAB — PLATELET COUNT: Platelet: 175 10*3/uL (ref 150–440)

## 2013-10-04 LAB — HEMOGLOBIN: HGB: 10.9 g/dL — ABNORMAL LOW (ref 13.0–18.0)

## 2013-10-05 LAB — BASIC METABOLIC PANEL
Anion Gap: 9 (ref 7–16)
BUN: 20 mg/dL — ABNORMAL HIGH (ref 7–18)
CALCIUM: 7.2 mg/dL — AB (ref 8.5–10.1)
Chloride: 109 mmol/L — ABNORMAL HIGH (ref 98–107)
Co2: 22 mmol/L (ref 21–32)
Creatinine: 1.32 mg/dL — ABNORMAL HIGH (ref 0.60–1.30)
EGFR (African American): 59 — ABNORMAL LOW
GFR CALC NON AF AMER: 51 — AB
GLUCOSE: 105 mg/dL — AB (ref 65–99)
Osmolality: 282 (ref 275–301)
Potassium: 3.7 mmol/L (ref 3.5–5.1)
Sodium: 140 mmol/L (ref 136–145)

## 2013-10-05 LAB — HEMOGLOBIN: HGB: 9.6 g/dL — ABNORMAL LOW (ref 13.0–18.0)

## 2013-10-05 LAB — CULTURE, BLOOD (SINGLE)

## 2013-10-05 LAB — VANCOMYCIN, TROUGH: Vancomycin, Trough: 19 ug/mL (ref 10–20)

## 2013-10-05 LAB — PLATELET COUNT: Platelet: 194 10*3/uL (ref 150–440)

## 2013-10-06 LAB — CBC WITH DIFFERENTIAL/PLATELET
Basophil #: 0.1 10*3/uL (ref 0.0–0.1)
Basophil %: 1.1 %
Eosinophil #: 0.5 10*3/uL (ref 0.0–0.7)
Eosinophil %: 4.8 %
HCT: 26.3 % — ABNORMAL LOW (ref 40.0–52.0)
HGB: 9.2 g/dL — ABNORMAL LOW (ref 13.0–18.0)
LYMPHS PCT: 12 %
Lymphocyte #: 1.1 10*3/uL (ref 1.0–3.6)
MCH: 31.1 pg (ref 26.0–34.0)
MCHC: 35.2 g/dL (ref 32.0–36.0)
MCV: 88 fL (ref 80–100)
MONO ABS: 0.9 x10 3/mm (ref 0.2–1.0)
Monocyte %: 9.3 %
NEUTROS ABS: 6.9 10*3/uL — AB (ref 1.4–6.5)
Neutrophil %: 72.8 %
Platelet: 223 10*3/uL (ref 150–440)
RBC: 2.97 10*6/uL — AB (ref 4.40–5.90)
RDW: 12.8 % (ref 11.5–14.5)
WBC: 9.5 10*3/uL (ref 3.8–10.6)

## 2013-10-06 LAB — SEDIMENTATION RATE: Erythrocyte Sed Rate: 67 mm/hr — ABNORMAL HIGH (ref 0–20)

## 2013-10-06 LAB — BASIC METABOLIC PANEL
ANION GAP: 9 (ref 7–16)
BUN: 25 mg/dL — ABNORMAL HIGH (ref 7–18)
CO2: 24 mmol/L (ref 21–32)
Calcium, Total: 7.7 mg/dL — ABNORMAL LOW (ref 8.5–10.1)
Chloride: 109 mmol/L — ABNORMAL HIGH (ref 98–107)
Creatinine: 1.28 mg/dL (ref 0.60–1.30)
EGFR (African American): >60
EGFR (Non-African Amer.): 53 — ABNORMAL LOW
Glucose: 96 mg/dL (ref 65–99)
Osmolality: 287 (ref 275–301)
POTASSIUM: 3.6 mmol/L (ref 3.5–5.1)
Sodium: 142 mmol/L (ref 136–145)

## 2013-10-07 LAB — CBC WITH DIFFERENTIAL/PLATELET
BASOS PCT: 1 %
Basophil #: 0.1 10*3/uL (ref 0.0–0.1)
EOS PCT: 5.4 %
Eosinophil #: 0.5 10*3/uL (ref 0.0–0.7)
HCT: 25.5 % — ABNORMAL LOW (ref 40.0–52.0)
HGB: 8.5 g/dL — ABNORMAL LOW (ref 13.0–18.0)
LYMPHS ABS: 1.1 10*3/uL (ref 1.0–3.6)
Lymphocyte %: 11.7 %
MCH: 30.6 pg (ref 26.0–34.0)
MCHC: 33.5 g/dL (ref 32.0–36.0)
MCV: 91 fL (ref 80–100)
MONO ABS: 0.7 x10 3/mm (ref 0.2–1.0)
Monocyte %: 7.4 %
NEUTROS PCT: 74.5 %
Neutrophil #: 6.8 10*3/uL — ABNORMAL HIGH (ref 1.4–6.5)
Platelet: 270 10*3/uL (ref 150–440)
RBC: 2.79 10*6/uL — AB (ref 4.40–5.90)
RDW: 12.8 % (ref 11.5–14.5)
WBC: 9.1 10*3/uL (ref 3.8–10.6)

## 2013-10-07 LAB — BASIC METABOLIC PANEL
Anion Gap: 4 — ABNORMAL LOW (ref 7–16)
BUN: 27 mg/dL — AB (ref 7–18)
Calcium, Total: 7.7 mg/dL — ABNORMAL LOW (ref 8.5–10.1)
Chloride: 108 mmol/L — ABNORMAL HIGH (ref 98–107)
Co2: 28 mmol/L (ref 21–32)
Creatinine: 1.32 mg/dL — ABNORMAL HIGH (ref 0.60–1.30)
GFR CALC AF AMER: 59 — AB
GFR CALC NON AF AMER: 51 — AB
GLUCOSE: 112 mg/dL — AB (ref 65–99)
Osmolality: 285 (ref 275–301)
Potassium: 3.6 mmol/L (ref 3.5–5.1)
Sodium: 140 mmol/L (ref 136–145)

## 2013-10-07 LAB — PATHOLOGY REPORT

## 2013-10-11 LAB — CULTURE, BLOOD (SINGLE)

## 2013-10-21 ENCOUNTER — Ambulatory Visit: Payer: Self-pay | Admitting: Internal Medicine

## 2014-03-21 DIAGNOSIS — Z9981 Dependence on supplemental oxygen: Secondary | ICD-10-CM | POA: Diagnosis not present

## 2014-03-21 DIAGNOSIS — I129 Hypertensive chronic kidney disease with stage 1 through stage 4 chronic kidney disease, or unspecified chronic kidney disease: Secondary | ICD-10-CM | POA: Diagnosis not present

## 2014-03-21 DIAGNOSIS — E785 Hyperlipidemia, unspecified: Secondary | ICD-10-CM | POA: Diagnosis not present

## 2014-03-21 DIAGNOSIS — Z9181 History of falling: Secondary | ICD-10-CM | POA: Diagnosis not present

## 2014-03-21 DIAGNOSIS — N189 Chronic kidney disease, unspecified: Secondary | ICD-10-CM | POA: Diagnosis not present

## 2014-03-21 DIAGNOSIS — S72012D Unspecified intracapsular fracture of left femur, subsequent encounter for closed fracture with routine healing: Secondary | ICD-10-CM | POA: Diagnosis not present

## 2014-03-21 DIAGNOSIS — F0390 Unspecified dementia without behavioral disturbance: Secondary | ICD-10-CM | POA: Diagnosis not present

## 2014-03-21 DIAGNOSIS — Z8673 Personal history of transient ischemic attack (TIA), and cerebral infarction without residual deficits: Secondary | ICD-10-CM | POA: Diagnosis not present

## 2014-03-21 DIAGNOSIS — G20 Parkinson's disease: Secondary | ICD-10-CM | POA: Diagnosis not present

## 2014-03-30 DIAGNOSIS — G20 Parkinson's disease: Secondary | ICD-10-CM | POA: Diagnosis not present

## 2014-03-30 DIAGNOSIS — R634 Abnormal weight loss: Secondary | ICD-10-CM | POA: Diagnosis not present

## 2014-03-30 DIAGNOSIS — Z8673 Personal history of transient ischemic attack (TIA), and cerebral infarction without residual deficits: Secondary | ICD-10-CM | POA: Diagnosis not present

## 2014-03-30 DIAGNOSIS — E785 Hyperlipidemia, unspecified: Secondary | ICD-10-CM | POA: Diagnosis not present

## 2014-03-30 DIAGNOSIS — S72012D Unspecified intracapsular fracture of left femur, subsequent encounter for closed fracture with routine healing: Secondary | ICD-10-CM | POA: Diagnosis not present

## 2014-03-30 DIAGNOSIS — Z9181 History of falling: Secondary | ICD-10-CM | POA: Diagnosis not present

## 2014-03-30 DIAGNOSIS — F0390 Unspecified dementia without behavioral disturbance: Secondary | ICD-10-CM | POA: Diagnosis not present

## 2014-03-30 DIAGNOSIS — I129 Hypertensive chronic kidney disease with stage 1 through stage 4 chronic kidney disease, or unspecified chronic kidney disease: Secondary | ICD-10-CM | POA: Diagnosis not present

## 2014-03-31 DIAGNOSIS — N189 Chronic kidney disease, unspecified: Secondary | ICD-10-CM | POA: Diagnosis not present

## 2014-04-13 DIAGNOSIS — N189 Chronic kidney disease, unspecified: Secondary | ICD-10-CM | POA: Diagnosis not present

## 2014-04-13 DIAGNOSIS — G20 Parkinson's disease: Secondary | ICD-10-CM | POA: Diagnosis not present

## 2014-04-13 DIAGNOSIS — Z8673 Personal history of transient ischemic attack (TIA), and cerebral infarction without residual deficits: Secondary | ICD-10-CM | POA: Diagnosis not present

## 2014-04-13 DIAGNOSIS — Z9181 History of falling: Secondary | ICD-10-CM | POA: Diagnosis not present

## 2014-04-13 DIAGNOSIS — Z9981 Dependence on supplemental oxygen: Secondary | ICD-10-CM | POA: Diagnosis not present

## 2014-04-13 DIAGNOSIS — I129 Hypertensive chronic kidney disease with stage 1 through stage 4 chronic kidney disease, or unspecified chronic kidney disease: Secondary | ICD-10-CM | POA: Diagnosis not present

## 2014-04-13 DIAGNOSIS — S72012D Unspecified intracapsular fracture of left femur, subsequent encounter for closed fracture with routine healing: Secondary | ICD-10-CM | POA: Diagnosis not present

## 2014-04-13 DIAGNOSIS — F0390 Unspecified dementia without behavioral disturbance: Secondary | ICD-10-CM | POA: Diagnosis not present

## 2014-04-13 DIAGNOSIS — E785 Hyperlipidemia, unspecified: Secondary | ICD-10-CM | POA: Diagnosis not present

## 2014-04-15 DIAGNOSIS — N189 Chronic kidney disease, unspecified: Secondary | ICD-10-CM | POA: Diagnosis not present

## 2014-04-15 DIAGNOSIS — G20 Parkinson's disease: Secondary | ICD-10-CM | POA: Diagnosis not present

## 2014-04-15 DIAGNOSIS — I129 Hypertensive chronic kidney disease with stage 1 through stage 4 chronic kidney disease, or unspecified chronic kidney disease: Secondary | ICD-10-CM | POA: Diagnosis not present

## 2014-04-15 DIAGNOSIS — S72012D Unspecified intracapsular fracture of left femur, subsequent encounter for closed fracture with routine healing: Secondary | ICD-10-CM | POA: Diagnosis not present

## 2014-04-15 DIAGNOSIS — Z8673 Personal history of transient ischemic attack (TIA), and cerebral infarction without residual deficits: Secondary | ICD-10-CM | POA: Diagnosis not present

## 2014-04-15 DIAGNOSIS — F0390 Unspecified dementia without behavioral disturbance: Secondary | ICD-10-CM | POA: Diagnosis not present

## 2014-04-15 DIAGNOSIS — E785 Hyperlipidemia, unspecified: Secondary | ICD-10-CM | POA: Diagnosis not present

## 2014-04-15 DIAGNOSIS — Z9981 Dependence on supplemental oxygen: Secondary | ICD-10-CM | POA: Diagnosis not present

## 2014-04-15 DIAGNOSIS — Z9181 History of falling: Secondary | ICD-10-CM | POA: Diagnosis not present

## 2014-05-14 NOTE — Consult Note (Signed)
PATIENT NAME:  Rick [name], Rick [name]  [id number] DATE OF BIRTH:  09/30/1931  DATE OF CONSULTATION:  10/06/2013  REFERRING PHYSICIAN:  Dr. Posey Pronto CONSULTING PHYSICIAN:  Cheral Marker. Ola Spurr, MD  REASON FOR CONSULTATION: Fever postoperative.   HISTORY OF PRESENT ILLNESS: This is a very pleasant 81 year old gentleman with underlying dementia who was admitted 09/10 after a fall and hip fracture. He lives at home but has 24-hour supervision. He has a history of dementia, as well as Parkinson's, and multiple CVAs. However, at home he is able to ambulate and goes with family to the gym. He had prior to his surgery to repair the hip fevers and was given vancomycin which seemed to make the fevers resolve. However, postoperatively he has developed more fevers again. He has not had any oral lesions, cough, abdominal pain, or diarrhea. He does have constipation and has not had a bowel movement since September 9. His postoperative incision seems intact and without infection. He has been more altered than usual and has not been eating much but has not had any evidence of aspiration per nursing. He has no skin wounds or infections. He does have on his left great toe an ingrown toenail that was addressed by podiatry that is somewhat red.   PAST MEDICAL HISTORY: 1.  Parkinson disease.  2.  Dementia and multiple cerebrovascular accidents.  3.  Hypertension.  4.  Hyperlipidemia.   PAST SURGICAL HISTORY: Hip replacement, as above.   SOCIAL HISTORY: Lives at home, has 24-hour supervision. He uses a cane for walking. No alcohol or drugs.   REVIEW OF SYSTEMS: Unable to be obtained.   FAMILY HISTORY: Noncontributory.   ALLERGIES: Codeine.   ANTIBIOTICS SINCE ADMISSION: Include cefazolin December 12 and 13 perioperatively,  ceftriaxone September 9 through the 13, vancomycin given September 11 through the 14.    PHYSICAL EXAMINATION: VITAL SIGNS: Temperature last 24 hours of 100.6, currently 99.7, pulse 68, blood  pressure 146/53, respirations 18. Last fever prior to this most recent 100.6 was on September 12 at 101, September 11 at 101.2 and September 10 at 101.3.   PHYSICAL EXAMINATION: VITAL SIGNS: He is thin, lying in bed, very quiet, and somewhat confused.  HEENT: Pupils equal, round, reactive to light and accommodation. Extraocular movements are intact. Sclerae are anicteric. Oropharynx is clear except mucous membranes are dry.  NECK: Supple.  HEART: Regular.  LUNGS: Clear.  ABDOMEN: Soft, mildly distended, nontender.  EXTREMITIES: No clubbing, cyanosis, or edema.  SKIN: His left incision site is within normal limits.  NEUROLOGIC: He is somewhat lethargic, able to move all 4 extremities.    LABORATORY DATA: White blood count currently is 9.5 was 15.2 on admission 09/10, hemoglobin 9.2, platelets 223,000. ESR elevated at 67. Differential shows 6.9 thousand neutrophils. Blood cultures x 2 September 10 are negative. Urine culture September 10 negative. Urinalysis September 10 had 24 white cells. CPK done on admission was 364. Bilirubin on admission was 1.2, albumin 3.2. Renal function is normal with creatinine at 1.28.   IMAGING: Ultrasound of his lower extremity done today was negative for DVT. Chest x-ray September 14 showed clear lungs. CT of the C-spine was negative on admission. CT of the head on admission showed no acute intracranial abnormalities.   IMPRESSION: A 81 year old gentleman postoperative from hip replacement September 13. Interestingly, he had a fall at home following debridement of a left great toe ingrown toenail. He had fevers preoperatively that resolved with vancomycin and ceftriaxone. Currently his fevers have recurred. He has  no obvious source. Blood cultures from the 10th are negative. Chest x-ray is normal. Urinalysis is normal. No skin infections and the wound site looks good. Dopplers are negative for DVTs. He is on quetiapine which is an antipsychotic, which it does not appear  he was on as an outpatient. The only other potential source is his left toe. I am always concerned about microaspiration causing these recurrent fevers, although his chest x-ray is clear. He has been apparently tolerating his food well.   RECOMMENDATIONS: 1.  Repeat blood cultures x 2 now.  2.  I would recommend placing him on doxycycline at this point. This would cover potentially any gram-positive organisms in his left great toe which is the most likely source of infection.  3.  Consider holding the quetiapine.   4.  If he continues to spike, I would ask speech and swallow to evaluate him for possible aspiration.   Thank you for the consult. I will be glad to follow with you.    ____________________________ Cheral Marker. Ola Spurr, MD dpf:at D: 10/06/2013 15:27:41 ET T: 10/06/2013 16:32:14 ET JOB#: [id number]  cc: Cheral Marker. Ola Spurr, MD, <Dictator> Lateia Fraser Ola Spurr MD ELECTRONICALLY SIGNED 10/24/2013 23:52

## 2014-05-14 NOTE — Op Note (Signed)
PATIENT NAME:  Rick [name], Rick [name]  [id number] DATE OF BIRTH:  January 30, 1931  DATE OF PROCEDURE:  10/03/2013  PREOPERATIVE DIAGNOSIS:  Displaced left femoral neck fracture.   POSTOPERATIVE DIAGNOSIS:  Displaced left femoral neck fracture.   PROCEDURE PERFORMED:  Degenerative arthrosis of the left knee.   PROCEDURE PERFORMED:  Left hip hemiarthroplasty.   SURGEON:  Illene Labrador. Angie Fava., MD.  ANESTHESIA:  General.   ESTIMATED BLOOD LOSS:  200 mL.   FLUIDS REPLACED: 900 mL of crystalloid.   DRAINS: Two medium drains to Hemovac reservoir.   IMPLANTS UTILIZED:  DePuy size 6 Summit femoral stem (cemented), 12-mm Cementralizer, a 53-mm outer diameter Cathcart hip ball, a +5-mm tapered spacer, and a size 4 cement restrictor.   INDICATIONS FOR SURGERY: The patient is a 81 year old male with history of Parkinson disease and dementia who fell and landed on his left hip and side. X-rays demonstrated a displaced left femoral neck fracture. After medical stabilization, risks and benefits of surgical intervention were discussed with the patient's family. They expressed understanding of the risks and benefits and agreed with plans for surgical intervention.   PROCEDURE IN DETAIL: The patient was brought to the operating room and after adequate general anesthesia was achieved, the patient was placed in right lateral decubitus position. An axillary roll was placed and all bony prominences were well padded. The patient's left hip and leg were cleaned and prepped with alcohol and DuraPrep and draped in the usual sterile fashion. A "timeout" was performed as per usual protocol. A lateral curvilinear incision was made gently curving towards the posterior superior iliac spine. IT band was incised in line with the skin incision. The fibers of the gluteus maximus were split in line. A small hematoma was noted posteriorly. The piriformis tendon was identified, skeletonized, incised at its insertion at the proximal  femur and reflected posteriorly. In a similar fashion, the short external rotators were incised and reflected posteriorly. A T-type posterior capsulotomy was performed. A corkscrew device was used to remove the femoral head. The femoral head was measured, both with calipers as well as with a ring sizers and it was felt that a 53-mm outer diameter was appropriate.   The acetabulum was inspected for any residual fracture fragments. Good articular surface was appreciated. The femoral neck cut was performed. Pilot hole was created and conical reamer was inserted. Serial broaches were inserted up to a size 6 broach. The calcar region was planed and trial reduction was performed with a 53-mm hip ball with a +5-mm neck length. This allowed for excellent equalization of limb lengths and excellent stability both anteriorly and posteriorly. Trial components were removed. The canal was sized, and it was felt that a size 4 cement restrictor was appropriate. A size 4 cement restrictor was inserted to the appropriate depth. Proximal femoral canal was irrigated with copious amounts of normal saline with antibiotic solution using pulsatile lavage and then suctioned dry. Vaginal packing soaked in dilute Neo-Synephrine was then used to pack the femoral canal. Polymethyl methacrylate cement with gentamicin was prepared in the usual fashion using a vacuum mixer. The additional packing was removed and the canal again irrigated and suctioned dry. The gentamicin cement was then introduced in a retrograde fashion and pressurized. A size 6 Summit femoral stem with a 12-mm distal Cementralizer was positioned and impacted into place. Excess cement was removed using Personal assistant.   After adequate curing of cement, the Morse taper was cleaned and dried and a 53-mm outer  diameter Cathcart hip ball with a +5-mm neck length was placed on the trunnion and impacted into place. The acetabulum was irrigated and again inspected for any residual  debris. The femoral head was then reduced, placed through a range of motion. Excellent stability was noted both anteriorly and posteriorly. Good equalization of limb lengths was appreciated. The wound was irrigated with copious amounts of normal saline with antibiotic solution using pulsatile lavage and then suctioned dry. Good hemostasis was appreciated. The posterior capsulotomy was repaired using #5 Ethibond. The piriformis tendon was reapproximated on the surface of the gluteus medius tendon using #5 Ethibond. Two medium drains were placed in the wound bed and brought out through a separate stab incisions to be attached to a Hemovac reservoir. IT band was repaired using interrupted sutures of #1 Vicryl. The subcutaneous tissue was approximated in layers using first #0 Vicryl followed by 2-0 Vicryl. Skin was closed with skin staples. A sterile dressing was applied. The patient tolerated the procedure well. He was transported to the recovery room in stable condition.     ____________________________ Illene LabradorJames P. Angie FavaHooten Jr., MD jph:lt D: 10/03/2013 14:31:07 ET T: 10/03/2013 15:54:06 ET JOB#: [id number]  cc: Fayrene FearingJames P. Angie FavaHooten Jr., MD, <Dictator> JAMES P Angie FavaHOOTEN JR MD ELECTRONICALLY SIGNED 10/04/2013 11:21

## 2014-05-14 NOTE — H&P (Signed)
PATIENT NAME:  Rick [name], Rick [name]  [id number] DATE OF BIRTH:  01-22-1931  DATE OF ADMISSION:  09/30/2013  PRIMARY DOCTOR:  Lorie Phenix, MD.   EMERGENCY ROOM PHYSICIAN:  Toney Rakes, MD.  CHIEF COMPLAINT:  Fall, hip fracture.   HISTORY OF PRESENT ILLNESS: A 81 year old male with history of dementia, hypertension, history of multiple strokes, brought in by family because of fall and inability to ambulate. The patient had a fall last night due to balance problem. Today in the morning family noticed that he was having pain when he tried to walk. The patient has dementia and has a 24-hour caregiver.  The patient not able to give much history but according to the caregiver he did not have any recent illness in terms of fever or weakness in the legs. did not have any cough, did not have any trouble urinating. Did not have any nausea or vomiting.   PAST MEDICAL HISTORY: Significant for hypertension, hyperlipidemia, multiple strokes and dementia.   ALLERGIES: CODEINE.   SOCIAL HISTORY: No smoking, no drinking. Lives at home. The patient uses a cane for walking and has dementia, and is sometimes agitated.     MEDICATIONS:   Amlodipine 2.5 mg p.o. daily, Plavix 75 mg p.o. daily, Aricept 10 mg p.o. daily, glycopyrrolate 2 mg p.o. twice daily, lisinopril 40 mg p.o. daily, metoprolol tartrate 50 mg p.o. b.i.d.   REVIEW OF SYSTEMS: Unobtainable because of dementia.   PHYSICAL EXAMINATION:  VITAL SIGNS: Temperature 97.6, heart rate 90 bpm, blood pressure 172/80,, oxygen saturation 100% on room air as per chart. GENERAL:  Well-developed, well-nourished male, not in distress.  HEAD: Atraumatic, normocephalic.  EYES: Pupils equal, reacting to light. Extraocular movements intact. NOSE: No nasal lesions. No drainage noted.  EARS: No drainage or lesions.  MOUTH: No lesions. No exudates.  NECK: Supple, symmetric, no masses. Thyroid midline, not enlarged. No JVD. Neck is nontender.  RESPIRATORY:  Good  respectively effort. Clear to auscultation. No using accessory muscles of respiration.  CARDIOVASCULAR: S1, S2  regular. No murmurs. No gallops. Pulses are equal in carotid and femoral artery.  GASTROINTESTINAL: Abdomen is soft, nontender, nondistended. Bowel sounds present. No hepatosplenomegaly. The patient does not have any hernias.  MUSCULOSKELETAL: The patient noted to have shortening of the left leg. SKIN: Inspection is normal, well-hydrated.  MUSCULOSKELETAL: Left hip is tender, shortened and externally rotated left leg. The patient does have good pedal pulses.  NEUROLOGIC: The patient is demented, but no gross focal neurological deficit.   PSYCHIATRIC:  He is demented, awake but not oriented to time or place.   LABORATORY DATA:   Electrolytes: Sodium is 136, potassium 4, chloride 102, bicarbonate 27, BUN 20, creatinine 1.41, glucose 107. LFTs: bilirubin 1.4, WBC 15.2, hemoglobin is 14, hematocrit 41.7, platelets 200, INR 1.1. Troponin less than 0.02.   CT of cervical spine shows no acute intracranial pathology.   Head CAT scan shows no acute intracranial pathology.   Left hip x-ray shows acute subcapital fracture of the left hip.  EKG: Normal sinus rhythm at 73 beats per minute.   ASSESSMENT AND PLAN:  1.  This patient is a 81 year old male with left hip fracture due to a fall. Admit him to hospitalist sevice, Continue pain medications and consult orthopedics, Dr. Ernest Pine. I have continue anticoagulation with Lovenox as recommended by Dr. Ernest Pine.  2.  Chronic renal failure. The patient's baseline creatinine is around 1.5 and at this time the patient will have IV fluids.  Hold lisinopril and  see how he does and monitor kidney function closely.  3.  History of multiple strokes, hold Plavix in preparation for surgery.  4.  Hypertension. Continue his home medication, metoprolol.  5.  Dementia. Continue him on Aricept.   TIME SPENT: About 55 minutes.  The patient is at moderate risk for  left hip surgery because of his advanced age, dementia, hypertension, and multiple strokes.    ____________________________ Katha HammingSnehalatha Preston Weill, MD sk:lt D: 09/30/2013 12:26:36 ET T: 09/30/2013 13:10:46 ET JOB#: [id number]  cc: Katha HammingSnehalatha Kahlee Metivier, MD, <Dictator> Katha HammingSNEHALATHA Latash Nouri MD ELECTRONICALLY SIGNED 10/30/2013 23:07

## 2014-05-14 NOTE — Consult Note (Signed)
Brief Consult Note: Diagnosis: left femoral neck fracture.   Patient was seen by consultant.   Comments: Recommend left hip hemiarthroplasty. The risks and benefits of surgical intervention were discussed in detail with the patient's family. They expressed understanding of the risks and benefits and agreed with plans for surgery.  Surgical site signed as per "right site surgery" protocol.   Patient developed temp of 101.3. Blood cultures obtained and Rochephin started as per Medicine, I discussed the situation with Dr. Henrene HawkingKephart (anesthesiology) who recommended postponing surgery tonight and monitoring temperature.  Electronic Signatures: Donato HeinzHooten, James P (MD)  (Signed 10-Sep-15 19:28)  Authored: Brief Consult Note   Last Updated: 10-Sep-15 19:28 by Donato HeinzHooten, James P (MD)

## 2014-05-14 NOTE — Discharge Summary (Signed)
PATIENT NAME:  Rick Rick [name], Rick Rick [name]  [id number] DATE OF BIRTH:  1931/12/01  DATE OF ADMISSION:  09/30/2013 DATE OF DISCHARGE:  10/08/2013  ADMITTING DIAGNOSIS: Fall, hip fracture.   DISCHARGE DIAGNOSES: 1.  Fall with hip fracture status post open reduction internal fixation of the left hip on 09/13.  2.  Fever, even prior to surgery, possibly related to left great toe ingrown toenail with infection. The patient's temperature curves improved.  3.  Acute encephalopathy, possibly related to surgery, pain medications as well as infection, now improved.  4.  Acute respiratory failure due to atelectasis and poor inspiratory effort. The patient needs incentive spirometry and ambulation as much as possible.  5.  Accelerated hypertension due to pain and agitation. Blood pressure now improved.  6.  Dementia.  7.  Parkinson disease.  8.  Acute on chronic renal failure. Renal function improved.  9.  Acute blood loss anemia postoperatively. Hemoglobin stable. Did not require transfusion.   PERTINENT LABS AND EVALUATIONS: Admitting glucose 90, BUN 25, creatinine 1.53, sodium 137, potassium 4.2, chloride 106, CO2 23, calcium 8.1. WBC 9.6, hemoglobin 13.6, platelet count 160,000. Blood cultures x2: No growth. Hemoglobin on the 17th was 8.5. The patient's glucose on admission was 90, BUN 25, creatinine 1.53, sodium 137, potassium 4.2, chloride 106, CO2 23. Most recent creatinine on the 17th was 1.32.   Chest x-ray done on the 17th showed posterior lung base opacity on the lateral radiograph. May represent pleural effusion or pneumonia.   CONSULTANTS DURING HOSPITALIZATION: Dr. Ernest PineHooten, Dr. Sampson GoonFitzgerald.   HISTORY OF PRESENT ILLNESS: Please refer to H and P done by the admitting physician.  HOSPITAL COURSE: The patient is Rick [name] 81 year old white male with history of dementia, hypertension, hyperlipidemia, and multiple CVAs in the past who had Rick [name] fall due to balance issues and was brought to the ED and was noted to  have Rick [name] left-sided hip fracture. The patient prior to surgery was noted to have some fever, given Rick [name] dose of vancomycin and underwent ORIF. Postop, the patient had agitation and confusion, felt to have acute encephalopathy as Rick [name] result of medications, acute surgery, as well as possible infection. The patient was treated with supportive care. His mental status is significantly improved. The patient also is having issues with hypoxia, and he is requiring oxygen, which will slowly need to be weaned off. His last chest x-ray is suggestive of possible pneumonia, but we feel it is probably atelectasis and the patient not taking deep breaths. The patient also was having fevers and was seen by ID. He does have Rick [name] left great toe that has an ingrown nail, could be Rick [name] source of infection. At this time, he is doing much better. His temperature is mostly normalized and is stable for discharge. He will need aggressive rehab.   DISCHARGE MEDICATIONS: Metoprolol tartrate 50 one tab p.o. b.i.d., amlodipine 2.5 daily, Plavix 75 p.o. daily, benazepril 10 daily, glycopyrrolate 1 mg two tablets b.i.d., lisinopril 40 daily, magnesium hydroxide 8% 30 mL b.i.d. as needed, Lovenox 30 mg for 14 days, oxycodone 5 mg 1 to 2 tabs q. 4 p.r.n., tramadol 50 one to two tabs p.o. q. 4 hours p.r.n., acetaminophen 500 one tab p.o. q. 4 hours p.r.n., Colace and senna 1 tablet p.o. b.i.d., bisacodyl 10 mg daily as needed, Protonix 40 one tab p.o. b.i.d., Bactrim/neomycin/polymyxin application topically daily, doxycycline 100 one tab p.o. q. 12 x5 days.  HOME OXYGEN: 3 liters.  DISCHARGE DIET: Low-sodium, mechanical soft, Ensure 2 times  Rick [name] day.   DISCHARGE ACTIVITY: As tolerated with PT evaluation and treatment.   DISCHARGE INSTRUCTIONS: Timeframe for followup with the nursing home MD in 1 to 2 weeks. Follow with [facility] orthopedics as scheduled, per their discharge instructions. Please refer to orthopedics discharge instructions for wound  care and staple removal. I recommend incentive spirometry while awake.  TIME SPENT ON THIS DISCHARGE: 45 minutes.   ____________________________ Lacie Scotts Allena Katz, MD shp:sb D: 10/08/2013 09:01:54 ET T: 10/08/2013 09:16:42 ET JOB#: [id number]  cc: Larina Lieurance H. Allena Katz, MD, <Dictator> Charise Carwin MD ELECTRONICALLY SIGNED 10/16/2013 7:57

## 2014-05-15 NOTE — H&P (Signed)
PATIENT NAME:  Rick [name], Rick [name]  [id number] DATE OF BIRTH:  07-27-1931  DATE OF ADMISSION:  05/31/2011  REFERRING PHYSICIAN: Glennie Isle, MD   PRIMARY CARE PHYSICIAN: Leo Grosser, MD    CHIEF COMPLAINT: Left finger numbness and facial droop.   HISTORY OF PRESENT ILLNESS: The patient is a 82 year old Caucasian male with a history of hypertension, hyperlipidemia, possible transient ischemic attack versus delirium, and a hypertensive emergency last year, who presents with the above complaints. The patient states that he is usually very active, and he goes to a gym for up to two hours doing free weights, treadmill and bicycle. He was at the gym this morning and about 10:00 had some left finger numbness. He also had some drooling on the left side and was told by staff there that he had some facial droop. EMS was called and apparently by the time he came into the hospital symptoms had resolved. The patient currently is here with her son, and per them symptoms lasted about 15 minutes. The patient had some sluggishness afterwards but currently is at his baseline. Hospitalist Services were contacted for further evaluation and management after CT of the head showed no acute CVA.   PAST MEDICAL HISTORY:  1. Hypertension. 2. Hyperlipidemia.  3. History of possible transient ischemic attack versus delirium.  4. History of hypertensive emergency last year. 5. Mild dementia.   PAST SURGICAL HISTORY:  1. Left elbow surgery.  2. Cholecystectomy.   ALLERGIES: Codeine.   SOCIAL HISTORY: Ex-smoker. He lives alone. He has one drink a day. No recreational drug use.  FAMILY HISTORY: Coronary artery disease and myocardial infarction in his father.   MEDICATIONS:  1. Aspirin 81 mg daily. 2. Bentyl 10 mg daily.  3. Lisinopril 40 mg daily.  4. Metoprolol tartrate 50 mg b.i.d.   5. Prilosec OTC 20 mg daily.   REVIEW OF SYSTEMS:  CONSTITUTIONAL: No fever, fatigue, weakness, or weight loss. EYES:  No blurry vision, double vision or glaucoma. ENT: No tinnitus. Has some rhinorrhea. No snoring or epistaxis. RESPIRATORY: Denies cough, wheezing, chronic obstructive pulmonary disease, dyspnea on exertion or asthma. CARDIOVASCULAR: No chest pain orthopnea. Has a history of high blood pressure and hypertensive emergency in the past. No dyspnea on exertion or palpitations. GI: No nausea, vomiting, diarrhea, abdominal pain, hematemesis or rectal bleeding. GENITOURINARY: Denies dysuria, hematuria, or frequency. ENDOCRINE: Denies polyuria, nocturia, or thyroid problems. HEMATOLOGIC/LYMPHATIC: Denies anemia or easy bruising. SKIN: Denies any new rashes. MUSCULOSKELETAL: Denies gout or arthritis. NEUROLOGIC: Possible transient ischemic attack last year, has mild dementia. PSYCHIATRIC: No anxiety or insomnia.   PHYSICAL EXAMINATION:  VITAL SIGNS: Temperature on arrival 96.7, pulse 60, respiratory rate 16. Blood pressure currently is 185/92, on arrival was 170/81. Oxygen saturation 99% on room air on arrival.   GENERAL: The patient a very pleasant elderly Caucasian male who looks better than his age, lying in bed in no obvious distress, talking in full sentences.   HEENT: Normocephalic, atraumatic. Pupils are equal and reactive. Anicteric sclerae. Moist mucous membranes.   NECK: Supple. No thyroid tenderness.   CARDIOVASCULAR: S1, S2. No murmurs, rubs, or gallops.   LUNGS: Clear to auscultation without wheezing or rhonchi or rales.   ABDOMEN: Soft, nontender, nondistended. Positive bowel sounds in all quadrants.   EXTREMITIES: No significant lower extremity edema.   NEUROLOGICAL: Cranial nerves II through XII are grossly intact. Sensation is intact to light touch and two-point discrimination. Romberg is negative.   PSYCHIATRIC: Awake, alert, and oriented  x3. Pleasant, conversant.   LABORATORY, DIAGNOSTIC AND RADIOLOGICAL DATA:  Sodium 138, potassium 4.5, and creatinine 1.39; it was 1.41 on discharge  last year. BUN is 13, glucose 81, calcium 8.4.  LFTs are within normal limits.  Troponin is negative.  WBC 7.3, hemoglobin is 14.7, hematocrit is 42, platelets are 218.  INR is 1.0.  EKG: Sinus bradycardia, don't see any acute ST elevations or depressions.  Echocardiogram done in March 2012 showing normal ejection fraction, no mitral regurgitation, possible impaired LV relaxation. No pericardial effusion.  CT scan of head without contrast showing mild diffuse cerebral and cerebellar atrophy. Ventricles are normal in size and position. No acute intracranial abnormality. Very mild age-related changes.   ASSESSMENT AND PLAN: We have a 81 year old Caucasian male with a history of hypertension with history of hypertensive emergency in the past, possible transient ischemic attack in the past, hyperlipidemia, who presents with left finger numbness and facial droop, now resolved. Symptoms suggest the patient had a transient ischemic attack. The patient had another episode two weeks ago with numbness in the left ankle which was perhaps seconds. The patient also has uncontrolled high blood pressure currently. At this point, we are going to admit the patient for observation and further work-up. The patient states that he has had an ultrasound of his carotids showing some irregularities; however, he was told by his doctor that it was "okay." I would obtain that record and defer the ultrasound until then. I would obtain an echocardiogram and place the patient on remote telemetry to look for any significant arrhythmias. I would check a lipid profile and start the patient on statin. He states he was on statin prior but that was stopped. I would also start the patient on Aggrenox and check frequent neuro checks. I would control his blood pressure better by adding p.r.n. hydralazine IV. He states that at baseline his blood pressure is well controlled. I would defer on ordering an MRI as the patient is asymptomatic currently  and back to his baseline. I would start the patient on heparin for deep vein thrombosis prophylaxis. The patient also has elevated creatinine; however, it appears based on the previous labs that perhaps it is more chronic in nature as his creatinine on last year was 1.4 and then 1.41 by discharge in February  to March of 2012. I would continue his lisinopril and beta blocker at this point.  TOTAL TIME SPENT: 50 minutes.   ____________________________ Krystal EatonShayiq Moyinoluwa Dawe, MD sa:cbb D: 05/31/2011 13:55:18 ET T: 05/31/2011 16:16:52 ET JOB#: [id number]  cc: Krystal EatonShayiq Keyli Duross, MD, <Dictator> Leo GrosserNancy J. Maloney, MD Marcelle SmilingSHAYIQ [facility] MD ELECTRONICALLY SIGNED 06/07/2011 16:30

## 2014-05-15 NOTE — Discharge Summary (Signed)
PATIENT NAME:  Rick Rick [name], Rick Rick [name]  [id number] DATE OF BIRTH:  [birth year]  DATE OF ADMISSION:  05/31/2011 DATE OF DISCHARGE:  06/01/2011  PRIMARY CARE PHYSICIAN: Rick Rick [name].   CHIEF COMPLAINT: Left finger numbness and facial droop.   DISCHARGE DIAGNOSES:  1. Transient ischemic attack. 2. Malignant hypertension.   SECONDARY DIAGNOSES:  Hyperlipidemia. 1. History of transient ischemic attack versus delirium.  2. History of hypertensive emergency. 3. Mild dementia.   DISCHARGE MEDICATIONS:  1. Prilosec OTC 20 mg daily.  2. Lisinopril 40 mg daily.  3. Metoprolol tartrate 50 mg b.i.d.  4. Donepezil 10 mg daily.  5. Amlodipine 5 mg daily.  6. Aggrenox 1 tab p.o. b.i.d.  7. Simvastatin 20 mg daily.   DIET: Low sodium.   ACTIVITY: As tolerated.   FOLLOWUP: Please follow up with your primary care physician within 1 to 2 days for Rick [name] blood pressure check and also with Dr. Wyn Rick [name] for follow-up of the carotid ultrasound. If there are recurrent symptoms, please call 911.   DISPOSITION: Home with Home Health.  HISTORY AND PHYSICAL: For full details, please see the History and Physical dictated on 05/31/2011 by Rick Rick [name]; but briefly the patient is Rick [name] who is pretty active at baseline with hypertension, hyperlipidemia, who came in with the above symptoms. The symptoms lasted about 15 minutes and had resolved by the time he had come to the Emergency Room. He had Rick [name] CT of the head which was negative for any acute events and was admitted to the Hospitalist Service for further evaluation and management. The patient had facial droop which was on the left as well as some numbness in the hand. He was also found to have significantly elevated blood pressure.   SIGNIFICANT LABORATORY, DIAGNOSTIC AND RADIOLOGICAL DATA:  Initial creatinine 1.39. Creatinine today 1.51. Potassium 4.5 on arrival, calcium 8.4, LDL 104, HDL 36. LFTs were within normal limits. TSH 1.55.  Troponin  negative x1.  WBC on arrival 7.3, hemoglobin 14.7, hematocrit 42.  INR 1.0.  Urinalysis not suggestive of infection. Echocardiogram showed ejection fraction of 55%, mild concentric left ventricular hypertrophy. Left atrium is mildly dilated. Right atrium is mildly dilated. Mild mitral regurgitation, mild tricuspid regurgitation. No apparent source of transient ischemic attack.  CT of the head without contrast showed no acute intracranial abnormalities. There were very mild age-related changes.   HOSPITAL COURSE: The patient was admitted to the Hospitalist Service for observation. He had Rick [name] negative CT of the head and the symptoms had resolved. His aspirin was held, and he was started on Aggrenox. An echocardiogram was obtained which did not show any source for Rick [name] transient ischemic attack. He was hooked up to Rick [name] telemetry monitor and did not have any significant arrhythmias. He had no significant neurological abnormalities on neurological exam, also. His pressures were elevated on arrival, and systolic was greater than 200. He required IV hydralazine p.r.n.  Furthermore, the patient was started on amlodipine. Currently blood pressures are 150s/60s. He also has undergone Rick [name] recent ultrasound of the carotids at Rick Rick [name] office. He was told by Dr. Wyn Rick [name] that he did not need any intervention, and there is some percentage of plaque which he does not remember. Attempt was made to try to get that result from Rick Rick [name] office; however, we have not received it yet, and this could be followed up as an outpatient. Therefore, an ultrasound of the carotids was not done on this admission. The patient was seen by Physical Therapy,  and recommendation was home with Home Health, and this would be hopefully planned for the patient.   CODE STATUS:  The patient is FULL CODE.     TOTAL TIME SPENT: 35 minutes.   ____________________________ Rick Eaton, MD sa:cbb D: 06/01/2011 16:07:11 ET T: 06/04/2011 11:03:02  ET JOB#: [id number]  cc: Rick Eaton, MD, <Dictator> Rick Grosser, MD Rick Rick [name] [facility] MD ELECTRONICALLY SIGNED 06/07/2011 16:30

## 2014-05-19 DIAGNOSIS — I10 Essential (primary) hypertension: Secondary | ICD-10-CM | POA: Diagnosis not present

## 2014-05-19 DIAGNOSIS — R634 Abnormal weight loss: Secondary | ICD-10-CM | POA: Diagnosis not present

## 2014-05-19 DIAGNOSIS — N182 Chronic kidney disease, stage 2 (mild): Secondary | ICD-10-CM | POA: Diagnosis not present

## 2014-05-19 DIAGNOSIS — K219 Gastro-esophageal reflux disease without esophagitis: Secondary | ICD-10-CM | POA: Diagnosis not present

## 2014-05-19 DIAGNOSIS — Z8673 Personal history of transient ischemic attack (TIA), and cerebral infarction without residual deficits: Secondary | ICD-10-CM | POA: Diagnosis not present

## 2014-05-19 DIAGNOSIS — G3183 Dementia with Lewy bodies: Secondary | ICD-10-CM | POA: Diagnosis not present

## 2014-05-19 DIAGNOSIS — G20 Parkinson's disease: Secondary | ICD-10-CM | POA: Diagnosis not present

## 2014-05-19 DIAGNOSIS — E785 Hyperlipidemia, unspecified: Secondary | ICD-10-CM | POA: Diagnosis not present

## 2014-05-24 DIAGNOSIS — I10 Essential (primary) hypertension: Secondary | ICD-10-CM | POA: Diagnosis not present

## 2014-06-09 DIAGNOSIS — G20 Parkinson's disease: Secondary | ICD-10-CM | POA: Diagnosis not present

## 2014-06-09 DIAGNOSIS — G3183 Dementia with Lewy bodies: Secondary | ICD-10-CM | POA: Diagnosis not present

## 2014-06-09 DIAGNOSIS — D649 Anemia, unspecified: Secondary | ICD-10-CM | POA: Diagnosis not present

## 2014-06-09 DIAGNOSIS — N182 Chronic kidney disease, stage 2 (mild): Secondary | ICD-10-CM | POA: Diagnosis not present

## 2014-06-09 DIAGNOSIS — K5900 Constipation, unspecified: Secondary | ICD-10-CM | POA: Diagnosis not present

## 2014-06-09 DIAGNOSIS — I10 Essential (primary) hypertension: Secondary | ICD-10-CM | POA: Diagnosis not present

## 2014-06-09 DIAGNOSIS — Z8673 Personal history of transient ischemic attack (TIA), and cerebral infarction without residual deficits: Secondary | ICD-10-CM | POA: Diagnosis not present

## 2014-06-09 DIAGNOSIS — K219 Gastro-esophageal reflux disease without esophagitis: Secondary | ICD-10-CM | POA: Diagnosis not present

## 2014-06-14 DIAGNOSIS — D649 Anemia, unspecified: Secondary | ICD-10-CM | POA: Diagnosis not present

## 2014-06-14 DIAGNOSIS — N189 Chronic kidney disease, unspecified: Secondary | ICD-10-CM | POA: Diagnosis not present

## 2014-07-07 DIAGNOSIS — Z8673 Personal history of transient ischemic attack (TIA), and cerebral infarction without residual deficits: Secondary | ICD-10-CM | POA: Diagnosis not present

## 2014-07-07 DIAGNOSIS — K219 Gastro-esophageal reflux disease without esophagitis: Secondary | ICD-10-CM | POA: Diagnosis not present

## 2014-07-07 DIAGNOSIS — N182 Chronic kidney disease, stage 2 (mild): Secondary | ICD-10-CM | POA: Diagnosis not present

## 2014-07-07 DIAGNOSIS — G3183 Dementia with Lewy bodies: Secondary | ICD-10-CM | POA: Diagnosis not present

## 2014-07-07 DIAGNOSIS — K5900 Constipation, unspecified: Secondary | ICD-10-CM | POA: Diagnosis not present

## 2014-07-07 DIAGNOSIS — I10 Essential (primary) hypertension: Secondary | ICD-10-CM | POA: Diagnosis not present

## 2014-07-07 DIAGNOSIS — D649 Anemia, unspecified: Secondary | ICD-10-CM | POA: Diagnosis not present

## 2014-07-07 DIAGNOSIS — G20 Parkinson's disease: Secondary | ICD-10-CM | POA: Diagnosis not present

## 2014-07-12 DIAGNOSIS — I129 Hypertensive chronic kidney disease with stage 1 through stage 4 chronic kidney disease, or unspecified chronic kidney disease: Secondary | ICD-10-CM | POA: Diagnosis not present

## 2014-07-29 DIAGNOSIS — G20 Parkinson's disease: Secondary | ICD-10-CM | POA: Diagnosis not present

## 2014-07-29 DIAGNOSIS — G3183 Dementia with Lewy bodies: Secondary | ICD-10-CM | POA: Diagnosis not present

## 2014-07-29 DIAGNOSIS — I10 Essential (primary) hypertension: Secondary | ICD-10-CM | POA: Diagnosis not present

## 2014-07-29 DIAGNOSIS — D649 Anemia, unspecified: Secondary | ICD-10-CM | POA: Diagnosis not present

## 2014-07-29 DIAGNOSIS — R634 Abnormal weight loss: Secondary | ICD-10-CM | POA: Diagnosis not present

## 2014-07-29 DIAGNOSIS — Z8673 Personal history of transient ischemic attack (TIA), and cerebral infarction without residual deficits: Secondary | ICD-10-CM | POA: Diagnosis not present

## 2014-07-29 DIAGNOSIS — E785 Hyperlipidemia, unspecified: Secondary | ICD-10-CM | POA: Diagnosis not present

## 2014-07-29 DIAGNOSIS — K219 Gastro-esophageal reflux disease without esophagitis: Secondary | ICD-10-CM | POA: Diagnosis not present

## 2014-07-29 DIAGNOSIS — N182 Chronic kidney disease, stage 2 (mild): Secondary | ICD-10-CM | POA: Diagnosis not present

## 2014-08-02 DIAGNOSIS — N189 Chronic kidney disease, unspecified: Secondary | ICD-10-CM | POA: Diagnosis not present

## 2014-08-25 DIAGNOSIS — R5383 Other fatigue: Secondary | ICD-10-CM | POA: Diagnosis not present

## 2014-10-10 DIAGNOSIS — G3183 Dementia with Lewy bodies: Secondary | ICD-10-CM | POA: Diagnosis not present

## 2014-10-10 DIAGNOSIS — Z8673 Personal history of transient ischemic attack (TIA), and cerebral infarction without residual deficits: Secondary | ICD-10-CM | POA: Diagnosis not present

## 2014-10-10 DIAGNOSIS — R634 Abnormal weight loss: Secondary | ICD-10-CM | POA: Diagnosis not present

## 2014-10-10 DIAGNOSIS — I10 Essential (primary) hypertension: Secondary | ICD-10-CM | POA: Diagnosis not present

## 2014-10-10 DIAGNOSIS — D649 Anemia, unspecified: Secondary | ICD-10-CM | POA: Diagnosis not present

## 2014-10-10 DIAGNOSIS — K219 Gastro-esophageal reflux disease without esophagitis: Secondary | ICD-10-CM | POA: Diagnosis not present

## 2014-10-10 DIAGNOSIS — N182 Chronic kidney disease, stage 2 (mild): Secondary | ICD-10-CM | POA: Diagnosis not present

## 2014-10-10 DIAGNOSIS — E785 Hyperlipidemia, unspecified: Secondary | ICD-10-CM | POA: Diagnosis not present

## 2014-10-10 DIAGNOSIS — G20 Parkinson's disease: Secondary | ICD-10-CM | POA: Diagnosis not present

## 2014-10-11 ENCOUNTER — Emergency Department: Payer: Commercial Managed Care - HMO

## 2014-10-11 ENCOUNTER — Encounter: Payer: Self-pay | Admitting: Radiology

## 2014-10-11 ENCOUNTER — Other Ambulatory Visit: Payer: Self-pay

## 2014-10-11 ENCOUNTER — Inpatient Hospital Stay
Admission: EM | Admit: 2014-10-11 | Discharge: 2014-10-18 | DRG: 154 | Disposition: A | Discharge status: Skilled Nursing Facility | Payer: Commercial Managed Care - HMO | Attending: Internal Medicine | Admitting: Internal Medicine

## 2014-10-11 DIAGNOSIS — K1121 Acute sialoadenitis: Principal | ICD-10-CM | POA: Diagnosis present

## 2014-10-11 DIAGNOSIS — F0280 Dementia in other diseases classified elsewhere without behavioral disturbance: Secondary | ICD-10-CM | POA: Diagnosis not present

## 2014-10-11 DIAGNOSIS — B370 Candidal stomatitis: Secondary | ICD-10-CM | POA: Diagnosis present

## 2014-10-11 DIAGNOSIS — Z79899 Other long term (current) drug therapy: Secondary | ICD-10-CM | POA: Diagnosis not present

## 2014-10-11 DIAGNOSIS — B9562 Methicillin resistant Staphylococcus aureus infection as the cause of diseases classified elsewhere: Secondary | ICD-10-CM | POA: Diagnosis not present

## 2014-10-11 DIAGNOSIS — G3183 Dementia with Lewy bodies: Secondary | ICD-10-CM | POA: Diagnosis not present

## 2014-10-11 DIAGNOSIS — E860 Dehydration: Secondary | ICD-10-CM | POA: Diagnosis not present

## 2014-10-11 DIAGNOSIS — K1120 Sialoadenitis, unspecified: Secondary | ICD-10-CM | POA: Diagnosis not present

## 2014-10-11 DIAGNOSIS — E785 Hyperlipidemia, unspecified: Secondary | ICD-10-CM | POA: Diagnosis not present

## 2014-10-11 DIAGNOSIS — Z515 Encounter for palliative care: Secondary | ICD-10-CM | POA: Diagnosis not present

## 2014-10-11 DIAGNOSIS — Z8249 Family history of ischemic heart disease and other diseases of the circulatory system: Secondary | ICD-10-CM | POA: Diagnosis not present

## 2014-10-11 DIAGNOSIS — E870 Hyperosmolality and hypernatremia: Secondary | ICD-10-CM | POA: Diagnosis not present

## 2014-10-11 DIAGNOSIS — I129 Hypertensive chronic kidney disease with stage 1 through stage 4 chronic kidney disease, or unspecified chronic kidney disease: Secondary | ICD-10-CM | POA: Diagnosis present

## 2014-10-11 DIAGNOSIS — Z7902 Long term (current) use of antithrombotics/antiplatelets: Secondary | ICD-10-CM

## 2014-10-11 DIAGNOSIS — N189 Chronic kidney disease, unspecified: Secondary | ICD-10-CM | POA: Diagnosis not present

## 2014-10-11 DIAGNOSIS — R221 Localized swelling, mass and lump, neck: Secondary | ICD-10-CM | POA: Diagnosis not present

## 2014-10-11 DIAGNOSIS — F0390 Unspecified dementia without behavioral disturbance: Secondary | ICD-10-CM | POA: Diagnosis not present

## 2014-10-11 DIAGNOSIS — R339 Retention of urine, unspecified: Secondary | ICD-10-CM | POA: Diagnosis present

## 2014-10-11 DIAGNOSIS — A419 Sepsis, unspecified organism: Secondary | ICD-10-CM | POA: Diagnosis not present

## 2014-10-11 DIAGNOSIS — Z8673 Personal history of transient ischemic attack (TIA), and cerebral infarction without residual deficits: Secondary | ICD-10-CM | POA: Diagnosis not present

## 2014-10-11 DIAGNOSIS — R509 Fever, unspecified: Secondary | ICD-10-CM | POA: Diagnosis not present

## 2014-10-11 DIAGNOSIS — L89152 Pressure ulcer of sacral region, stage 2: Secondary | ICD-10-CM | POA: Diagnosis present

## 2014-10-11 DIAGNOSIS — R4182 Altered mental status, unspecified: Secondary | ICD-10-CM

## 2014-10-11 DIAGNOSIS — F0391 Unspecified dementia with behavioral disturbance: Secondary | ICD-10-CM | POA: Diagnosis not present

## 2014-10-11 DIAGNOSIS — E440 Moderate protein-calorie malnutrition: Secondary | ICD-10-CM | POA: Diagnosis not present

## 2014-10-11 DIAGNOSIS — M542 Cervicalgia: Secondary | ICD-10-CM | POA: Diagnosis not present

## 2014-10-11 DIAGNOSIS — L8990 Pressure ulcer of unspecified site, unspecified stage: Secondary | ICD-10-CM | POA: Insufficient documentation

## 2014-10-11 DIAGNOSIS — G9341 Metabolic encephalopathy: Secondary | ICD-10-CM | POA: Diagnosis not present

## 2014-10-11 DIAGNOSIS — D72829 Elevated white blood cell count, unspecified: Secondary | ICD-10-CM | POA: Diagnosis not present

## 2014-10-11 DIAGNOSIS — R531 Weakness: Secondary | ICD-10-CM | POA: Diagnosis not present

## 2014-10-11 HISTORY — DX: Chronic kidney disease, unspecified: N18.9

## 2014-10-11 HISTORY — DX: Essential (primary) hypertension: I10

## 2014-10-11 HISTORY — DX: Dementia in other diseases classified elsewhere, unspecified severity, without behavioral disturbance, psychotic disturbance, mood disturbance, and anxiety: F02.80

## 2014-10-11 HISTORY — DX: Parkinson's disease: G20

## 2014-10-11 HISTORY — DX: Fracture of unspecified part of neck of unspecified femur, initial encounter for closed fracture: S72.009A

## 2014-10-11 HISTORY — DX: Hyperlipidemia, unspecified: E78.5

## 2014-10-11 HISTORY — DX: Transient cerebral ischemic attack, unspecified: G45.9

## 2014-10-11 HISTORY — DX: Dementia with Lewy bodies: G31.83

## 2014-10-11 LAB — COMPREHENSIVE METABOLIC PANEL
ALBUMIN: 2.9 g/dL — AB (ref 3.5–5.0)
ALT: 14 U/L — ABNORMAL LOW (ref 17–63)
ANION GAP: 7 (ref 5–15)
AST: 18 U/L (ref 15–41)
Alkaline Phosphatase: 93 U/L (ref 38–126)
BUN: 53 mg/dL — ABNORMAL HIGH (ref 6–20)
CHLORIDE: 120 mmol/L — AB (ref 101–111)
CO2: 28 mmol/L (ref 22–32)
Calcium: 8.4 mg/dL — ABNORMAL LOW (ref 8.9–10.3)
Creatinine, Ser: 1.85 mg/dL — ABNORMAL HIGH (ref 0.61–1.24)
GFR calc Af Amer: 38 mL/min — ABNORMAL LOW (ref >60)
GFR calc non Af Amer: 33 mL/min — ABNORMAL LOW (ref >60)
GLUCOSE: 112 mg/dL — AB (ref 65–99)
POTASSIUM: 3.4 mmol/L — AB (ref 3.5–5.1)
SODIUM: 155 mmol/L — AB (ref 135–145)
Total Bilirubin: 1.6 mg/dL — ABNORMAL HIGH (ref 0.3–1.2)
Total Protein: 7 g/dL (ref 6.5–8.1)

## 2014-10-11 LAB — URINALYSIS COMPLETE WITH MICROSCOPIC (ARMC ONLY)
BACTERIA UA: NONE SEEN
Bilirubin Urine: NEGATIVE
GLUCOSE, UA: NEGATIVE mg/dL
Ketones, ur: NEGATIVE mg/dL
Nitrite: NEGATIVE
PROTEIN: NEGATIVE mg/dL
SPECIFIC GRAVITY, URINE: 1.018 (ref 1.005–1.030)
pH: 5 (ref 5.0–8.0)

## 2014-10-11 LAB — CBC WITH DIFFERENTIAL/PLATELET
BASOS ABS: 0 10*3/uL (ref 0–0.1)
Basophils Relative: 0 %
Eosinophils Absolute: 0 10*3/uL (ref 0–0.7)
Eosinophils Relative: 0 %
HEMATOCRIT: 32.7 % — AB (ref 40.0–52.0)
Hemoglobin: 10.8 g/dL — ABNORMAL LOW (ref 13.0–18.0)
LYMPHS PCT: 6 %
Lymphs Abs: 0.9 10*3/uL — ABNORMAL LOW (ref 1.0–3.6)
MCH: 29.6 pg (ref 26.0–34.0)
MCHC: 33.1 g/dL (ref 32.0–36.0)
MCV: 89.6 fL (ref 80.0–100.0)
MONO ABS: 0.7 10*3/uL (ref 0.2–1.0)
MONOS PCT: 5 %
NEUTROS ABS: 13.7 10*3/uL — AB (ref 1.4–6.5)
Neutrophils Relative %: 89 %
Platelets: 251 10*3/uL (ref 150–440)
RBC: 3.65 MIL/uL — ABNORMAL LOW (ref 4.40–5.90)
RDW: 13.1 % (ref 11.5–14.5)
WBC: 15.3 10*3/uL — ABNORMAL HIGH (ref 3.8–10.6)

## 2014-10-11 LAB — TYPE AND SCREEN
ABO/RH(D): O POS
Antibody Screen: NEGATIVE

## 2014-10-11 LAB — LACTIC ACID, PLASMA
Lactic Acid, Venous: 1.1 mmol/L (ref 0.5–2.0)
Lactic Acid, Venous: 0.7 mmol/L (ref 0.5–2.0)

## 2014-10-11 LAB — PROTIME-INR
INR: 1.3
PROTHROMBIN TIME: 16.4 s — AB (ref 11.4–15.0)

## 2014-10-11 LAB — ABO/RH: ABO/RH(D): O POS

## 2014-10-11 LAB — APTT: APTT: 38 s — AB (ref 24–36)

## 2014-10-11 MED ORDER — BISACODYL 10 MG RE SUPP: 10.0000 mg | Freq: Every day | RECTAL | Status: DC | PRN

## 2014-10-11 MED ORDER — SODIUM CHLORIDE 0.9 % IV BOLUS (SEPSIS)
1000.0000 mL | INTRAVENOUS | Status: DC
  Administered 2014-10-11: 1000 mL via INTRAVENOUS

## 2014-10-11 MED ORDER — ACETAMINOPHEN 650 MG RE SUPP: 650.0000 mg | Freq: Four times a day (QID) | RECTAL | Status: DC | PRN

## 2014-10-11 MED ORDER — INFLUENZA VAC SPLIT QUAD 0.5 ML IM SUSY
0.5000 mL | PREFILLED_SYRINGE | INTRAMUSCULAR | Status: AC
  Administered 2014-10-13: 0.5 mL via INTRAMUSCULAR
  Filled 2014-10-11 (×2): qty 0.5

## 2014-10-11 MED ORDER — DEXAMETHASONE SODIUM PHOSPHATE 10 MG/ML IJ SOLN
10.0000 mg | Freq: Once | INTRAMUSCULAR | Status: AC
  Administered 2014-10-11: 10 mg via INTRAVENOUS
  Filled 2014-10-11: qty 1

## 2014-10-11 MED ORDER — HEPARIN SODIUM (PORCINE) 5000 UNIT/ML IJ SOLN
5000.0000 [IU] | Freq: Three times a day (TID) | INTRAMUSCULAR | Status: DC
  Administered 2014-10-11 – 2014-10-18 (×20): 5000 [IU] via SUBCUTANEOUS
  Filled 2014-10-11 (×20): qty 1

## 2014-10-11 MED ORDER — PNEUMOCOCCAL VAC POLYVALENT 25 MCG/0.5ML IJ INJ
0.5000 mL | INJECTION | INTRAMUSCULAR | Status: AC
  Administered 2014-10-12: 0.5 mL via INTRAMUSCULAR
  Filled 2014-10-11: qty 0.5

## 2014-10-11 MED ORDER — SODIUM CHLORIDE 0.9 % IV SOLN
3.0000 g | Freq: Two times a day (BID) | INTRAVENOUS | Status: DC
  Administered 2014-10-11: 3 g via INTRAVENOUS
  Filled 2014-10-11 (×4): qty 3

## 2014-10-11 MED ORDER — DOCUSATE SODIUM 100 MG PO CAPS: 100.0000 mg | ORAL_CAPSULE | Freq: Two times a day (BID) | ORAL | Status: DC | PRN

## 2014-10-11 MED ORDER — DEXTROSE-NACL 5-0.45 % IV SOLN
INTRAVENOUS | Status: DC
  Administered 2014-10-11: 17:00:00 via INTRAVENOUS

## 2014-10-11 MED ORDER — HYDRALAZINE HCL 20 MG/ML IJ SOLN: 10.0000 mg | Freq: Four times a day (QID) | INTRAMUSCULAR | Status: DC | PRN

## 2014-10-11 MED ORDER — IOHEXOL 300 MG/ML  SOLN
60.0000 mL | Freq: Once | INTRAMUSCULAR | Status: AC | PRN
  Administered 2014-10-11: 60 mL via INTRAVENOUS

## 2014-10-11 MED ORDER — DEXAMETHASONE SODIUM PHOSPHATE 4 MG/ML IJ SOLN
4.0000 mg | Freq: Four times a day (QID) | INTRAMUSCULAR | Status: DC
  Administered 2014-10-11 – 2014-10-13 (×7): 4 mg via INTRAVENOUS
  Filled 2014-10-11 (×7): qty 1

## 2014-10-11 MED ORDER — SODIUM CHLORIDE 0.9 % IV SOLN
3.0000 g | Freq: Once | INTRAVENOUS | Status: AC
  Administered 2014-10-11: 3 g via INTRAVENOUS
  Filled 2014-10-11: qty 3

## 2014-10-11 MED ORDER — ACETAMINOPHEN 325 MG PO TABS: 650.0000 mg | ORAL_TABLET | Freq: Four times a day (QID) | ORAL | Status: DC | PRN

## 2014-10-11 MED ORDER — SENNA 8.6 MG PO TABS: 1.0000 | ORAL_TABLET | Freq: Every day | ORAL | Status: DC | PRN

## 2014-10-11 MED ORDER — ALBUTEROL SULFATE (2.5 MG/3ML) 0.083% IN NEBU: 2.5000 mg | INHALATION_SOLUTION | RESPIRATORY_TRACT | Status: DC | PRN

## 2014-10-11 MED ORDER — FLUCONAZOLE IN SODIUM CHLORIDE 100-0.9 MG/50ML-% IV SOLN
100.0000 mg | INTRAVENOUS | Status: DC
  Administered 2014-10-11 – 2014-10-17 (×7): 100 mg via INTRAVENOUS
  Filled 2014-10-11 (×8): qty 50

## 2014-10-11 NOTE — ED Notes (Signed)
Patient transported to CT 

## 2014-10-11 NOTE — Progress Notes (Signed)
ANTIBIOTIC CONSULT NOTE - INITIAL  Pharmacy Consult for Unasyn Indication: sialadenitis  Allergies  Allergen Reactions  . Codeine Other (See Comments)    UNK    Patient Measurements: Height:  (175.3 cm) Weight: 133 lb 9.6 oz (60.601 kg) IBW/kg (Calculated) : 70.7 Adjusted Body Weight:   Vital Signs: Temp: 98 F (36.7 C) (09/20 1620) Temp Source: Axillary (09/20 1620) BP: 134/97 mmHg (09/20 1620) Pulse Rate: 72 (09/20 1620) Intake/Output from previous day:   Intake/Output from this shift: Total I/O In: 1000 [Other:1000] Out: 1250 [Urine:1250]  Labs:  Recent Labs  10/11/14 1127  WBC 15.3*  HGB 10.8*  PLT 251  CREATININE 1.85*   Estimated Creatinine Clearance: 27.3 mL/min (by C-G formula based on Cr of 1.85). No results for input(s): VANCOTROUGH, VANCOPEAK, VANCORANDOM, GENTTROUGH, GENTPEAK, GENTRANDOM, TOBRATROUGH, TOBRAPEAK, TOBRARND, AMIKACINPEAK, AMIKACINTROU, AMIKACIN in the last 72 hours.   Microbiology: No results found for this or any previous visit (from the past 720 hour(s)).  Medical History: Past Medical History  Diagnosis Date  . Hypertension   . Lewy body dementia   . Parkinson's disease   . CKD (chronic kidney disease)   . TIA (transient ischemic attack)   . Hyperlipidemia   . Hip fracture     Medications:  Prescriptions prior to admission  Medication Sig Dispense Refill Last Dose  . acetaminophen (TYLENOL) 500 MG tablet Take 500 mg by mouth every 4 (four) hours as needed for fever.   PRN  . amLODipine (NORVASC) 2.5 MG tablet Take 2.5 mg by mouth daily.   unknown  . bisacodyl (DULCOLAX) 10 MG suppository Place 10 mg rectally daily as needed for moderate constipation.   PRN  . clopidogrel (PLAVIX) 75 MG tablet Take 75 mg by mouth daily.   unknown  . donepezil (ARICEPT ODT) 10 MG disintegrating tablet Take 10 mg by mouth daily.   unknown  . glycopyrrolate (ROBINUL) 1 MG tablet Take 2 mg by mouth 2 (two) times daily.   unknown  .  lisinopril (PRINIVIL,ZESTRIL) 40 MG tablet Take 40 mg by mouth daily.   unknown  . magnesium hydroxide (MILK OF MAGNESIA) 400 MG/5ML suspension Take 30 mLs by mouth every 12 (twelve) hours as needed for mild constipation.   PRN  . metoprolol (LOPRESSOR) 50 MG tablet Take 50 mg by mouth 2 (two) times daily.   unknown  . senna-docusate (SENOKOT-S) 8.6-50 MG per tablet Take 1 tablet by mouth 2 (two) times daily.   unknown  . vitamin C (ASCORBIC ACID) 500 MG tablet Take 500 mg by mouth 2 (two) times daily.   unknown  . donepezil (ARICEPT) 10 MG tablet TAKE 1 TABLET (10 MG TOTAL) BY MOUTH DAILY. (Patient not taking: Reported on 10/11/2014) 90 tablet 0 Not Taking   Assessment: CrCl = 27.3 ml/min  Goal of Therapy:  resolution of infection  Plan:  Expected duration 7 days with resolution of temperature and/or normalization of WBC   Will order Unasyn 3 gm IV Q12H to start 9/21 @ 00:00.   Robbins,Jason D 10/11/2014,5:22 PM

## 2014-10-11 NOTE — ED Notes (Signed)
Pt returned from Ct at this time

## 2014-10-11 NOTE — ED Notes (Signed)
Patient returned from CT

## 2014-10-11 NOTE — H&P (Addendum)
[facility] [place] at [facility]   PATIENT NAME: Rick [name]    MR#:  [id number]  DATE OF BIRTH:  Nov 16, 1931  DATE OF ADMISSION:  10/11/2014  PRIMARY CARE PHYSICIAN: Dr. Elease Hashimoto  REQUESTING/REFERRING PHYSICIAN: Dr. Langston Masker  CHIEF COMPLAINT:   Chief Complaint  Patient presents with  . Fever  . Oral Swelling    HISTORY OF PRESENT ILLNESS:  Aviraj Kentner  is a 81 y.o. male with a known history of Lewy body dementia, Parkinson's disease, CK D and hypertension, who has been a resident of [facility] skilled nursing facility was brought in secondary to lethargy and fevers. Patient, due to his dementia, does not interact much according to family. His daughter-in-law is at bedside and provides most of the history. Patient is lethargic on exam. His mouth is open and very dry. According to daughter-in-law, his dementia has progressively gotten worse in the last few months. His speech is not understandable at baseline. He does not have dysphagia according to her. 2 days ago they noticed that he was weak and lethargic, worse than his baseline. Once this morning he was also noticed to have fevers and worsening of his left neck swelling and so presented to the emergency room. CT of the neck shows possible enlargement and inflammation of left submandibular salivary gland. So patient is being admitted for sepsis secondary to sialadenitis. Labs also noted to have hypernatremia. Suprapubic area swelling noted on exam-bladder scan ordered.  PAST MEDICAL HISTORY:   Past Medical History  Diagnosis Date  . Hypertension   . Lewy body dementia   . Parkinson's disease   . CKD (chronic kidney disease)   . TIA (transient ischemic attack)   . Hyperlipidemia   . Hip fracture     PAST SURGICAL HISTORY:   Past Surgical History  Procedure Laterality Date  . None      SOCIAL HISTORY:   Social History  Substance Use Topics  . Smoking status: Never Smoker   .  Smokeless tobacco: Not on file  . Alcohol Use: No    FAMILY HISTORY:   Family History  Problem Relation Age of Onset  . CAD Mother   . CAD Father     DRUG ALLERGIES:   Allergies  Allergen Reactions  . Codeine Other (See Comments)    UNK    REVIEW OF SYSTEMS:   Review of Systems  Unable to perform ROS: dementia    MEDICATIONS AT HOME:   Prior to Admission medications   Medication Sig Start Date End Date Taking? Authorizing Provider  acetaminophen (TYLENOL) 500 MG tablet Take 500 mg by mouth every 4 (four) hours as needed for fever.   Yes Historical Provider, MD  amLODipine (NORVASC) 2.5 MG tablet Take 2.5 mg by mouth daily.   Yes Historical Provider, MD  bisacodyl (DULCOLAX) 10 MG suppository Place 10 mg rectally daily as needed for moderate constipation.   Yes Historical Provider, MD  clopidogrel (PLAVIX) 75 MG tablet Take 75 mg by mouth daily.   Yes Historical Provider, MD  donepezil (ARICEPT ODT) 10 MG disintegrating tablet Take 10 mg by mouth daily.   Yes Historical Provider, MD  glycopyrrolate (ROBINUL) 1 MG tablet Take 2 mg by mouth 2 (two) times daily.   Yes Historical Provider, MD  lisinopril (PRINIVIL,ZESTRIL) 40 MG tablet Take 40 mg by mouth daily.   Yes Historical Provider, MD  magnesium hydroxide (MILK OF MAGNESIA) 400 MG/5ML suspension Take 30 mLs by mouth every 12 (twelve)  hours as needed for mild constipation.   Yes Historical Provider, MD  metoprolol (LOPRESSOR) 50 MG tablet Take 50 mg by mouth 2 (two) times daily.   Yes Historical Provider, MD  senna-docusate (SENOKOT-S) 8.6-50 MG per tablet Take 1 tablet by mouth 2 (two) times daily.   Yes Historical Provider, MD  vitamin C (ASCORBIC ACID) 500 MG tablet Take 500 mg by mouth 2 (two) times daily.   Yes Historical Provider, MD  donepezil (ARICEPT) 10 MG tablet TAKE 1 TABLET (10 MG TOTAL) BY MOUTH DAILY. Patient not taking: Reported on 10/11/2014 11/10/12   Huston Foley, MD      VITAL SIGNS:  Blood pressure  148/74, pulse 79, temperature 99.9 F (37.7 C), temperature source Rectal, resp. rate 16, weight 67.586 kg (149 lb), SpO2 99 %.  PHYSICAL EXAMINATION:   Physical Exam  GENERAL:  81 y.o.-year-old patient lying in the bed, no acute distress. Appears lethargic. EYES: Pupils equal, round, reactive to light and accommodation. No scleral icterus. Extraocular muscles intact.  HEENT: Head atraumatic, normocephalic. Oropharynx very dry on exam, oral thrush noted around the tongue base, poor hyegine of oral cavity, eduntulous. Nasopharynx normal on exam. NECK:  Supple, no jugular venous distention. No thyroid enlargement, no tenderness.  LUNGS: Normal breath sounds bilaterally, no wheezing, rales,rhonchi or crepitation. No use of accessory muscles of respiration.  CARDIOVASCULAR: S1, S2 normal. No rubs, or gallops. 3/6 systolic murmur heard. ABDOMEN: Soft, , but distended in the suprapubic region. Bowel sounds present. No organomegaly or mass.  EXTREMITIES: No pedal edema, cyanosis, or clubbing.  NEUROLOGIC: lethargic, unable to follow commands, withdrawing to pain, arousable. Moving all four extremities  PSYCHIATRIC: The patient is lethargic but arousable  SKIN: No obvious rash, lesion, or ulcer.   LABORATORY PANEL:   CBC  Recent Labs Lab 10/11/14 1127  WBC 15.3*  HGB 10.8*  HCT 32.7*  PLT 251   ------------------------------------------------------------------------------------------------------------------  Chemistries   Recent Labs Lab 10/11/14 1127  NA 155*  K 3.4*  CL 120*  CO2 28  GLUCOSE 112*  BUN 53*  CREATININE 1.85*  CALCIUM 8.4*  AST 18  ALT 14*  ALKPHOS 93  BILITOT 1.6*   ------------------------------------------------------------------------------------------------------------------  Cardiac Enzymes No results for input(s): TROPONINI in the last 168  hours. ------------------------------------------------------------------------------------------------------------------  RADIOLOGY:  Ct Soft Tissue Neck W Contrast  10/11/2014   CLINICAL DATA:  Anterior neck swelling for 3 days.  EXAM: CT NECK WITH CONTRAST  TECHNIQUE: Multidetector CT imaging of the neck was performed using the standard protocol following the bolus administration of intravenous contrast.  CONTRAST:  60mL OMNIPAQUE IOHEXOL 300 MG/ML  SOLN  COMPARISON:  None.  FINDINGS: Pharynx and larynx: No definite abnormality seen. Epiglottis and larynx appear normal.  Salivary glands: Parotid and right submandibular glands appear normal. Left submandibular gland is enlarged with inflammatory stranding noted in the adjacent soft tissues.  Thyroid: Normal.  Lymph nodes: Mildly enlarged lymph nodes are noted in the left sub mandibular region with the largest measuring 8 mm.  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  Limited intracranial: No abnormality seen.  Visualized orbits: Visualized portions appear normal.  Mastoids and visualized paranasal sinuses: Visualized portions appear normal.  Skeleton: Severe degenerative disc disease is noted at C5-6 and C6-7.  Upper chest: No significant abnormality is noted in the visualized lung fields.  IMPRESSION: Left submandibular gland appears enlarged with inflammatory stranding in the adjacent soft tissues as well as mildly enlarged lymph nodes, consistent with  inflammation of unknown etiology.  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   Electronically Signed   By: Lupita Raider, M.D.   On: 10/11/2014 13:23   Dg Chest Port 1 View  10/11/2014   CLINICAL DATA:  Sepsis.  Fever.  EXAM: PORTABLE CHEST - 1 VIEW  COMPARISON:  10/07/2013  FINDINGS: The heart size and mediastinal contours are within normal limits. Both lungs are clear. The visualized skeletal structures are unremarkable.  IMPRESSION: No active disease.    Electronically Signed   By: Charlett Nose M.D.   On: 10/11/2014 11:16    EKG:   Orders placed or performed during the hospital encounter of 10/11/14  . ED EKG 12-Lead  . ED EKG 12-Lead    IMPRESSION AND PLAN:   Geral Coker  is a 81 y.o. male with a known history of Lewy body dementia, Parkinson's disease, CK D and hypertension, who has been a resident of [facility] skilled nursing facility was brought in secondary to lethargy and fevers.   1. Sepsis-secondary to acute left submandibular gland infection. -Keep him nothing by mouth, IV fluids, ENT consult. No abscess noted on CT. -Started on Decadron IV and also Unasyn -Blood cultures have been done  2. Acute urinary retention-lower scan done and patient has more than thousand mL of urine. -Insert Foley catheter. Once able to take oral medications, we'll have to start Flomax and do a voiding trial. -Send urine analysis  3. Hypernatremia-dehydration and free water deficit -Started on D5 half normal saline and monitor sodium  4. Metabolic encephalopathy-secondary to sepsis and also hypernatremia. -Worse than his baseline. Continue to monitor  5. Hypertension-since nothing by mouth, will hold his oral medications. -On IV hydralazine when necessary  6. DVT prophylaxis-on subcutaneous heparin  7. Oral candidiasis-since patient cannot take anything by mouth, will start IV fluconazole Speech therapy consult   All the records are reviewed and case discussed with ED provider. Management plans discussed with the patient, family and they are in agreement.  CODE STATUS: Full Code Discussed CODE STATUS with daughter-in-law at bedside, patient's son and power of attorney will come to the hospital later this evening and daughter-in-law suggested to speak with him. For now based on his papers will continue a full code  TOTAL TIME TAKING CARE OF THIS PATIENT: 50 minutes.    KALISETTI,RADHIKA M.D on 10/11/2014 at 2:30 PM  Between  7am to 6pm - Pager - [phone]  After 6pm go to [email or web address] - password [email or web address] [facility] [address]  [place] [facility]  Office  [phone]  CC: Primary care physician; No primary care provider on file.

## 2014-10-11 NOTE — Consult Note (Signed)
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 Subsequently was noted to have altered mental status and neck swelling was brought to the emergency room for evaluation of same CT scan showed what appears to be left submandibular gland sialadenitis there was no abscess no airway compromise.  Allergies:  Allergies  Allergen Reactions  . Codeine Other (See Comments)    UNK    ROS: Review of systems normal other than 12 systems except per HPI. Patient is asleep and minimally responsive does suffer from severe dry mouth  PMH:  Past Medical History  Diagnosis Date  . Hypertension   . Lewy body dementia   . Parkinson's disease   . CKD (chronic kidney disease)   . TIA (transient ischemic attack)   . Hyperlipidemia   . Hip fracture     FH:  Family History  Problem Relation Age of Onset  . CAD Mother   . CAD Father     SH:  Social History   Social History  . Marital Status: Divorced    Spouse Name: N/A  . Number of Children: N/A  . Years of Education: N/A   Occupational History  . Not on file.   Social History Main Topics  . Smoking status: Never Smoker   . Smokeless tobacco: Not on file  . Alcohol Use: No  . Drug Use: No  . Sexual Activity: Not on file   Other Topics Concern  . Not on file   Social History Narrative   Lives at [facility]    PSH:  Past Surgical History  Procedure Laterality Date  . None      Physical  Exam: Minimally responsive on no airway compromise CN 2-12 grossly intact and symmetric. EAC/TMs normal BL. Severe dry mucous membranes there is debris which appears to be old food within the mouth  I cleaned this out as much as possible massage the left submandibular gland had obvious purulence coming from Warthin's duct on the left a culture was sent of this purulence there is no evidence of floor mouth swelling no airway compromise. Skin warm and dry. Nasal cavity without polyps or purulence. External nose and ears without masses or lesions. EOMI, PERRLA. Neck shows firm left submandibular gland with tenderness bimanual palpation of floor mouth I could palpate no stone. No lymphadenopathy palpated. Thyroid normal with no masses. Review of CT scan confirms findings of left submandibular gland sialadenitis   A/P: Left submandibular gland sialadenitis-I have sent a culture of the pus coming from Warthin's duct on the left would continue Unasyn 3 g IV every 6 hours and Decadron 10 mg IV every 8 hours until these culture results have been confirmed. I would also recommend initiating Peridex mouth rinses to clean out this debris from the mouth I am happy to see him as an outpatient follow-up once he has been discharged from the hospital. Any further questions feel free to contact me either by pager or at my office   [facility] T 10/11/2014 5:12 PM

## 2014-10-11 NOTE — ED Notes (Addendum)
Lactic due at 1430, accidentally clicked off early. Will collect Lactic as ordered at 1430.

## 2014-10-11 NOTE — ED Notes (Signed)
Pt from [facility] with reports from staff that pt began having throat swelling and white patches to back of throat. Pt has also been febrile.

## 2014-10-11 NOTE — ED Provider Notes (Signed)
[facility] Emergency Department Provider Note  ____________________________________________  Time seen: Seen upon arrival to the emergency department  I have reviewed the triage vital signs and the nursing notes.   HISTORY  Chief Complaint Fever and Oral Swelling    HPI Rick SWINGLER Sr. is a 81 y.o. male with a history of hypertension and dementia who is presenting today with fever and throat swelling. The patient is normally conversive and has been altered over the past day. Per his family he has had worsening swelling of his throat. The patient is unable to give history because he is unable to talk at this time.Was found to have a temperature 100.8 in the field.   No past medical history on file.  Patient Active Problem List   Diagnosis Date Noted  . COPD UNSPECIFIED 11/29/2009  . HYPERLIPIDEMIA 08/08/2009  . HYPERTENSION, BENIGN 08/08/2009  . COUGH 08/08/2009    No past surgical history on file.  Current Outpatient Rx  Name  Route  Sig  Dispense  Refill  . donepezil (ARICEPT) 10 MG tablet      TAKE 1 TABLET (10 MG TOTAL) BY MOUTH DAILY.   90 tablet   0     Allergies Codeine  No family history on file.  Social History Social History  Substance Use Topics  . Smoking status: Not on file  . Smokeless tobacco: Not on file  . Alcohol Use: Not on file    Review of Systems Caveat secondary to nonverbal secondary to altered mental status. ____________________________________________   PHYSICAL EXAM:  VITAL SIGNS: ED Triage Vitals  Enc Vitals Group     BP 10/11/14 1104 96/62 mmHg     Pulse Rate 10/11/14 1104 82     Resp 10/11/14 1104 22     Temp 10/11/14 1104 99.9 F (37.7 C)     Temp Source 10/11/14 1104 Rectal     SpO2 10/11/14 1104 98 %     Weight 10/11/14 1104 149 lb (67.586 kg)     Height --      Head Cir --      Peak Flow --      Pain Score --      Pain Loc --      Pain Edu? --      Excl. in GC? --      Constitutional: Alert but nonverbal. In no acute distress. Mouth open.  Eyes: Conjunctivae are normal. PERRL. EOMI. Head: Atraumatic. Nose: No congestion/rhinnorhea. Mouth/Throat: Mouth open with dry mucous membranes. No exudate to the posterior pharynx as well as the soft palate. No visible swelling in the pharynx. Floor the mouth is soft. Dentures were removed. Neck: No stridor.  Anterior swelling which is just distal to the angle of the mandible and is hard. There is no overlying induration. Cardiovascular: Normal rate, regular rhythm. Grossly normal heart sounds.  Good peripheral circulation. Respiratory: Normal respiratory effort.  No retractions. Lungs CTAB. Gastrointestinal: Soft and nontender. No distention. No abdominal bruits. No CVA tenderness. Musculoskeletal: No lower extremity tenderness nor edema.  No joint effusions. Neurologic:  No gross focal neurologic deficits are appreciated. Skin:  Skin is warm, dry and intact. No rash noted.   ____________________________________________   LABS (all labs ordered are listed, but only abnormal results are displayed)  Labs Reviewed  CULTURE, BLOOD (ROUTINE X 2)  CULTURE, BLOOD (ROUTINE X 2)  COMPREHENSIVE METABOLIC PANEL  CBC WITH DIFFERENTIAL/PLATELET  LACTIC ACID, PLASMA  LACTIC ACID, PLASMA  APTT  PROTIME-INR  TYPE AND SCREEN   ____________________________________________  EKG  ED ECG REPORT I, Arelia Longest, the attending physician, personally viewed and interpreted this ECG.   Date: 10/11/2014  EKG Time: 1051  Rate: 86  Rhythm: Normal sinus rhythm with premature supraventricular complexes.  Axis: Left axis deviation  Intervals:none  ST&T Change: No ST segment elevation or depression. No abnormal T-wave inversion.  ____________________________________________  RADIOLOGY  Left submandibular gland appears enlarged with inflammatory stranding. No obvious  abscess. ____________________________________________   PROCEDURES  CRITICAL CARE Performed by: Arelia Longest   Total critical care time: 35 minutes  Critical care time was exclusive of separately billable procedures and treating other patients.  Critical care was necessary to treat or prevent imminent or life-threatening deterioration.  Critical care was time spent personally by me on the following activities: development of treatment plan with patient and/or surrogate as well as nursing, discussions with consultants, evaluation of patient's response to treatment, examination of patient, obtaining history from patient or surrogate, ordering and performing treatments and interventions, ordering and review of laboratory studies, ordering and review of radiographic studies, pulse oximetry and re-evaluation of patient's condition.  Sepsis protocol initiated because of febrile the field with obvious source.  ____________________________________________   INITIAL IMPRESSION / ASSESSMENT AND PLAN / ED COURSE  Pertinent labs & imaging results that were available during my care of the patient were reviewed by me and considered in my medical decision making (see chart for details).  ----------------------------------------- 11:10 AM on 10/11/2014 ----------------------------------------- Discussed the case with ENT Dr. Jenne Campus who recommends Unasyn and Decadron. Patient is protecting his airway at this time and is satting in the upper 90s on room air.  We'll wait for CAT scan results of the soft tissue of the neck.  ----------------------------------------- 1:50 PM on 10/11/2014 -----------------------------------------  Patient remains at same level of consciousness. Still satting well on room air. Discussed case Dr. Jenne Campus of ENT who recommends steroids as well as antibiotics. Updated family as to imaging findings. Signed out to Dr. Imogene Burn of the medicine service for admission.  Aware to continue Unasyn as well as Decadron. ____________________________________________   FINAL CLINICAL IMPRESSION(S) / ED DIAGNOSES  Acute altered mental status likely secondary to infection in the submandibular gland. Initial visit.    Myrna Blazer, MD 10/11/14 1352

## 2014-10-11 NOTE — ED Notes (Signed)
MD Kalisetti at bedside at this time  

## 2014-10-12 DIAGNOSIS — F0280 Dementia in other diseases classified elsewhere without behavioral disturbance: Secondary | ICD-10-CM | POA: Diagnosis not present

## 2014-10-12 DIAGNOSIS — G9341 Metabolic encephalopathy: Secondary | ICD-10-CM | POA: Diagnosis not present

## 2014-10-12 DIAGNOSIS — B370 Candidal stomatitis: Secondary | ICD-10-CM | POA: Diagnosis not present

## 2014-10-12 DIAGNOSIS — E440 Moderate protein-calorie malnutrition: Secondary | ICD-10-CM | POA: Diagnosis not present

## 2014-10-12 DIAGNOSIS — G3183 Dementia with Lewy bodies: Secondary | ICD-10-CM | POA: Diagnosis not present

## 2014-10-12 DIAGNOSIS — L89152 Pressure ulcer of sacral region, stage 2: Secondary | ICD-10-CM | POA: Diagnosis not present

## 2014-10-12 DIAGNOSIS — K1121 Acute sialoadenitis: Secondary | ICD-10-CM | POA: Diagnosis not present

## 2014-10-12 DIAGNOSIS — B9562 Methicillin resistant Staphylococcus aureus infection as the cause of diseases classified elsewhere: Secondary | ICD-10-CM | POA: Diagnosis not present

## 2014-10-12 DIAGNOSIS — E870 Hyperosmolality and hypernatremia: Secondary | ICD-10-CM | POA: Diagnosis not present

## 2014-10-12 LAB — BASIC METABOLIC PANEL
ANION GAP: 5 (ref 5–15)
BUN: 51 mg/dL — ABNORMAL HIGH (ref 6–20)
CHLORIDE: 126 mmol/L — AB (ref 101–111)
CO2: 27 mmol/L (ref 22–32)
Calcium: 8.2 mg/dL — ABNORMAL LOW (ref 8.9–10.3)
Creatinine, Ser: 1.6 mg/dL — ABNORMAL HIGH (ref 0.61–1.24)
GFR calc non Af Amer: 39 mL/min — ABNORMAL LOW (ref >60)
GFR, EST AFRICAN AMERICAN: 45 mL/min — AB (ref >60)
GLUCOSE: 173 mg/dL — AB (ref 65–99)
POTASSIUM: 3.5 mmol/L (ref 3.5–5.1)
Sodium: 158 mmol/L — ABNORMAL HIGH (ref 135–145)

## 2014-10-12 LAB — CBC
HEMATOCRIT: 30.5 % — AB (ref 40.0–52.0)
HEMOGLOBIN: 10.4 g/dL — AB (ref 13.0–18.0)
MCH: 30.7 pg (ref 26.0–34.0)
MCHC: 33.9 g/dL (ref 32.0–36.0)
MCV: 90.5 fL (ref 80.0–100.0)
Platelets: 230 10*3/uL (ref 150–440)
RBC: 3.38 MIL/uL — AB (ref 4.40–5.90)
RDW: 13.2 % (ref 11.5–14.5)
WBC: 12.7 10*3/uL — ABNORMAL HIGH (ref 3.8–10.6)

## 2014-10-12 LAB — SODIUM
SODIUM: 149 mmol/L — AB (ref 135–145)
Sodium: 156 mmol/L — ABNORMAL HIGH (ref 135–145)
Sodium: 151 mmol/L — ABNORMAL HIGH (ref 135–145)

## 2014-10-12 MED ORDER — VANCOMYCIN HCL IN DEXTROSE 750-5 MG/150ML-% IV SOLN
750.0000 mg | INTRAVENOUS | Status: DC
  Administered 2014-10-13: 750 mg via INTRAVENOUS
  Filled 2014-10-12 (×2): qty 150

## 2014-10-12 MED ORDER — SODIUM CHLORIDE 0.9 % IV SOLN
3.0000 g | Freq: Four times a day (QID) | INTRAVENOUS | Status: DC
  Administered 2014-10-12 – 2014-10-13 (×5): 3 g via INTRAVENOUS
  Filled 2014-10-12 (×9): qty 3

## 2014-10-12 MED ORDER — POTASSIUM CL IN DEXTROSE 5% 20 MEQ/L IV SOLN
20.0000 meq | INTRAVENOUS | Status: DC
  Administered 2014-10-12 – 2014-10-13 (×4): 20 meq via INTRAVENOUS
  Filled 2014-10-12 (×9): qty 1000

## 2014-10-12 MED ORDER — VANCOMYCIN HCL IN DEXTROSE 1-5 GM/200ML-% IV SOLN
1000.0000 mg | Freq: Once | INTRAVENOUS | Status: AC
  Administered 2014-10-12: 1000 mg via INTRAVENOUS
  Filled 2014-10-12: qty 200

## 2014-10-12 NOTE — Progress Notes (Signed)
   10/12/14 1100  Clinical Encounter Type  Visited With Patient  Visit Type Initial  Referral From Nurse  Spiritual Encounters  Spiritual Needs Prayer  Chaplain thomas engaged patient for prayer. Patient appeared sleep. Chaplain will follow up when extended family arrives.

## 2014-10-12 NOTE — Evaluation (Signed)
Physical Therapy Evaluation Patient Details Name: Rick [name] Sr. MRN: [id number] DOB: [birth year] Today's Date: 10/12/2014   History of Present Illness  81 yo male with onset of inflammation of L submandibular gland and discovered transverse aortic arch aneurysm was admitted, clear chest CT scan.  Clinical Impression  Pt was seen for assessing his basic ability to assist movement and due to Lewy body dementia he is fairly limited to move.  No PLOF is availiable for comparison and will anticipate SNF can screen to see if further needs there are indicated.  Will see to try to ease the burden of care with sitting and transfer control for now.    Follow Up Recommendations SNF    Equipment Recommendations  None recommended by PT    Recommendations for Other Services       Precautions / Restrictions Precautions Precautions: Fall (telemetry) Restrictions Weight Bearing Restrictions: No      Mobility  Bed Mobility Overal bed mobility: Needs Assistance;+2 for physical assistance;+ 2 for safety/equipment Bed Mobility: Supine to Sit;Sit to Supine     Supine to sit: Total assist Sit to supine: Total assist   General bed mobility comments: Pt is not able to initiate and needs help to lift trunk and LE's  Transfers Overall transfer level: Needs assistance Equipment used: Rolling walker (2 wheeled);1 person hand held assist Transfers: Sit to/from Stand Sit to Stand: Total assist;From elevated surface (attempted with HHA and walker)         General transfer comment: unable to use LE's effectively, no history  Ambulation/Gait             General Gait Details: unable  Stairs            Wheelchair Mobility    Modified Rankin (Stroke Patients Only)       Balance                                             Pertinent Vitals/Pain Pain Assessment: No/denies pain    Home Living Family/patient expects to be discharged to:: Skilled nursing  facility                      Prior Function Level of Independence: Needs assistance   Gait / Transfers Assistance Needed: pt cannot stand and has contracture of L hip   ADL's / Homemaking Assistance Needed: SNF resident        Hand Dominance        Extremity/Trunk Assessment   Upper Extremity Assessment: Generalized weakness           Lower Extremity Assessment: Generalized weakness;LLE deficits/detail (L hip is in adduction contraction and sits under RLE )   LLE Deficits / Details: Needs a pillow to separate knees due to contracture and risk of skin breakdown  Cervical / Trunk Assessment: Kyphotic  Communication   Communication: Expressive difficulties;Other (comment) (Lewy body dementia)  Cognition Arousal/Alertness: Awake/alert Behavior During Therapy: Flat affect Overall Cognitive Status: Difficult to assess                      General Comments General comments (skin integrity, edema, etc.): Pt is very weak and may be at PLOF, no history noted to detail his abilities    Exercises        Assessment/Plan    PT Assessment Patient  needs continued PT services  PT Diagnosis Generalized weakness;Altered mental status   PT Problem List Decreased strength;Decreased range of motion;Decreased activity tolerance;Decreased balance;Decreased mobility;Decreased coordination;Decreased cognition;Decreased knowledge of use of DME;Decreased safety awareness;Decreased knowledge of precautions;Cardiopulmonary status limiting activity;Decreased skin integrity  PT Treatment Interventions Functional mobility training;Therapeutic activities;Therapeutic exercise;Balance training;Neuromuscular re-education;Patient/family education   PT Goals (Current goals can be found in the Care Plan section) Acute Rehab PT Goals Patient Stated Goal: none stated PT Goal Formulation: Patient unable to participate in goal setting Time For Goal Achievement: 10/26/14 Potential to  Achieve Goals: Fair    Frequency Min 2X/week   Barriers to discharge Other (comment) (lives in SNF) none x medical concerns    Co-evaluation               End of Session Equipment Utilized During Treatment: Gait belt Activity Tolerance: Patient limited by fatigue;Patient limited by lethargy Patient left: in bed;with call bell/phone within reach;with bed alarm set Nurse Communication: Mobility status         Time: 0912-0938 PT Time Calculation (min) (ACUTE ONLY): 26 min   Charges:   PT Evaluation $Initial PT Evaluation Tier I: 1 Procedure PT Treatments $Therapeutic Activity: 8-22 mins   PT G Codes:        Ivar Drape 13-Oct-2014, 10:04 AM   Samul Dada, PT MS Acute Rehab Dept. Number: [id number] and [facility] [phone]

## 2014-10-12 NOTE — Evaluation (Signed)
Clinical/Bedside Swallow Evaluation Patient Details  Name: Rick TESCHNER Sr. MRN: [id number] Date of Birth: 08-17-1931  Today's Date: 10/12/2014 Time: SLP Start Time (ACUTE ONLY): 1045 SLP Stop Time (ACUTE ONLY): 1145 SLP Time Calculation (min) (ACUTE ONLY): 60 min  Past Medical History:  Past Medical History  Diagnosis Date  . Hypertension   . Lewy body dementia   . Parkinson's disease   . CKD (chronic kidney disease)   . TIA (transient ischemic attack)   . Hyperlipidemia   . Hip fracture    Past Surgical History:  Past Surgical History  Procedure Laterality Date  . None     HPI:  Rick [name] is a 81 y.o. male with a known history of Lewy body dementia, Parkinson's disease, CK D and hypertension, who has been a resident of [facility] skilled nursing facility was brought in secondary to lethargy and fevers. Patient, due to his dementia, does not interact much according to family. His daughter-in-law is at bedside and provides most of the history. Patient is lethargic on exam. His mouth is open and very dry. According to daughter-in-law, his dementia has progressively gotten worse in the last few months. His speech is not understandable at baseline. He does not have dysphagia according to her. 2 days ago they noticed that he was weak and lethargic, worse than his baseline. Once this morning he was also noticed to have fevers and worsening of his left neck swelling and so presented to the emergency room.   Assessment / Plan / Recommendation Clinical Impression  Pt presents with moderate oral pharyngeal phase dysphagia. Pt showed overt and immediate s/s of aspiration on thin liquid trial of water by cup (immediately followed by coughing episode, throat clearning and sneezing). Pt has poor awareness of bolus across consistencies and requires extensive verbal, visual and tactile cues to complete task. Pt has dry mouth and lips and was observed by ST swallowing with mouth open at  points throughout visit. Pt would benefit from modified diet of nectar thick liquids and purees with strict aspiration precautions.  NSG updated. Meds crushed in puree as able. Pt's baseline dementia and Parkinson's Disease significantly increase his risk for aspiration.    Aspiration Risk  Mild (-moderate)    Diet Recommendation Dysphagia 1 (Puree);Nectar   Medication Administration: Crushed with puree (as able) Compensations: Small sips/bites;Slow rate;Check for pocketing;Minimize environmental distractions    Other  Recommendations Oral Care Recommendations: Oral care QID;Oral care before and after PO;Staff/trained caregiver to provide oral care   Follow Up Recommendations       Frequency and Duration min 2x/week  1 week   Pertinent Vitals/Pain None reported    SLP Swallow Goals   See care plan   Swallow Study Prior Functional Status   Pt has known history of dementia and previously consumed regular diet (per chart).    General Date of Onset: 10/11/14 Other Pertinent Information: Rick [name] is a 81 y.o. male with a known history of Lewy body dementia, Parkinson's disease, CK D and hypertension, who has been a resident of [facility] skilled nursing facility was brought in secondary to lethargy and fevers. Patient, due to his dementia, does not interact much according to family. His daughter-in-law is at bedside and provides most of the history. Patient is lethargic on exam. His mouth is open and very dry. According to daughter-in-law, his dementia has progressively gotten worse in the last few months. His speech is not understandable at baseline. He does not have dysphagia  according to her. 2 days ago they noticed that he was weak and lethargic, worse than his baseline. Once this morning he was also noticed to have fevers and worsening of his left neck swelling and so presented to the emergency room. Type of Study: Bedside swallow evaluation Diet Prior to this Study:  Regular Temperature Spikes Noted: No Respiratory Status: Room air History of Recent Intubation: No Behavior/Cognition: Alert;Cooperative;Confused;Lethargic/Drowsy;Distractible;Requires cueing;Doesn't follow directions (at times, doesn't follow directions; w/cuing completes task) Oral Cavity - Dentition: Edentulous Self-Feeding Abilities: Total assist;Needs set up Patient Positioning: Upright in bed Baseline Vocal Quality: Normal (adaquate vocal intensity with spontaneous speech) Volitional Cough: Strong Volitional Swallow: Able to elicit    Oral/Motor/Sensory Function Overall Oral Motor/Sensory Function: Appears within functional limits for tasks assessed Labial ROM: Within Functional Limits Labial Symmetry: Within Functional Limits Labial Strength:  (Nt) Labial Sensation:  (NT) Lingual ROM: Within Functional Limits Lingual Symmetry: Within Functional Limits Lingual Strength:  (NT) Lingual Sensation:  (NT) Facial ROM:  (NT) Facial Symmetry: Within Functional Limits Facial Strength:  (NT) Facial Sensation:  (NT) Velum:  (NT) Mandible: Within Functional Limits (as observed in bolus managment)   Ice Chips Ice chips: Within functional limits Presentation: Spoon Other Comments: WFL for ice chips, however, overt s/s of aspiration noted on water when presented by cup   Thin Liquid Thin Liquid: Impaired Presentation: Cup Oral Phase Impairments: Reduced labial seal;Impaired anterior to posterior transit;Poor awareness of bolus Oral Phase Functional Implications: Prolonged oral transit;Left anterior spillage;Right anterior spillage Pharyngeal  Phase Impairments: Suspected delayed Swallow;Cough - Immediate;Throat Clearing - Immediate (1 sip water resulted in coughing fit + throat clear, sneezes) Other Comments: Pt. took one sip of water by cup from ST and showed overt s/s of aspiration immediately (coughing, throat clear, sneezing).  Coughing episode immediately following PO intake of water  (small sip)    Nectar Thick Nectar Thick Liquid: Impaired Presentation: Spoon Oral Phase Impairments: Reduced labial seal;Reduced lingual movement/coordination;Impaired anterior to posterior transit;Poor awareness of bolus Oral phase functional implications: Right anterior spillage;Left anterior spillage;Oral residue Pharyngeal Phase Impairments:  (No overt s/s of aspiration noted on these trials (see other)) Other Comments: Pt took 2-3 ounces of nectar thick cranberry juice by spoon; pt. did throat clear after one PO trial (suspect residual sensation from coughing episode after PO water sip). Pt has poor oral awareness and requires extensive visual and tactile cuing (press spoon on tongue to elicit labial seal and swallow initiaion; place spoon on front 1/3 of tongue). Pt occaisionally would swallow with mouth completely open.  Verbal cues to close mouth are necessary (but not always followed). Pt. has very dry mouth and lips.   Honey Thick Honey Thick Liquid: Not tested   Puree Puree: Impaired Presentation: Spoon Oral Phase Impairments: Reduced labial seal;Poor awareness of bolus Oral Phase Functional Implications: Right anterior spillage;Left anterior spillage;Prolonged oral transit;Oral holding;Oral residue Pharyngeal Phase Impairments:  (No overt s/s of aspiration noted on these trials) Other Comments: As with nectars, pt benefited from spoon presentation and pressing on front 1/3 of tongue to initiate labial seal and swallow.    Solid   GO    Solid: Not tested       Meghan Esper 10/12/2014,12:09 PM

## 2014-10-12 NOTE — Progress Notes (Signed)
[facility] [place] [facility]   PATIENT NAME: Rick [name]    MR#:  [id number]  DATE OF BIRTH:  11/09/1931  SUBJECTIVE: Unable to provide due to altered mental status  CHIEF COMPLAINT:   Chief Complaint  Patient presents with  . Fever  . Oral Swelling   patient is 81 year old male with history of dementia, Parkinson's disease. CK D, hypertension, who is a resident of [facility] skilled nursing facility, was brought to the hospital with lethargy, fever as well as left-sided neck swelling. He was noted to have elevated white blood cell count of 15.3. He was initiated on antibiotic Unasyn and ENT consultation was requested. Dr. Jenne Campus saw patient in consultation and felt that patient had left-sided mandible gland inflammation, obtained pus for cultures and recommended antibiotics and steroids IV. Patient is a little bit more responsive today, able to answer some questions and follows some commands, however, not able to review of systems  Review of Systems  Unable to perform ROS: medical condition    VITAL SIGNS: Blood pressure 132/60, pulse 65, temperature 97.7 F (36.5 C), temperature source Oral, resp. rate 20, height  (1.753 m), weight 60.601 kg (133 lb 9.6 oz), SpO2 98 %.  PHYSICAL EXAMINATION:   GENERAL:  81 y.o.-year-old patient lying in the bed with no acute distress, comfortable on the bed. Able to open eyes and answers simple questions inconsistently.  EYES: Pupils equal, round, reactive to light and accommodation. No scleral icterus. Extraocular muscles intact.  HEENT: Head atraumatic, normocephalic. Oropharynx and nasopharynx clear.  NECK:  Supple, no jugular venous distention. No thyroid enlargement, no tenderness. Swollen submandibular gland was noted on the left, but no significant tenderness was noted on palpation LUNGS: Diminished breath sounds bilaterally, no wheezing, rales,rhonchi or crepitation. No use of accessory muscles of  respiration.  CARDIOVASCULAR: S1, S2 normal. No murmurs, rubs, or gallops.  ABDOMEN: Soft, nontender, nondistended. Bowel sounds present. No organomegaly or mass.  EXTREMITIES: No pedal edema, cyanosis, or clubbing.  NEUROLOGIC: Cranial nerves grossly II through XII are intact. Muscle strength , unable to evaluate in all extremities. Due to poor mental status. Sensation not evaluated. Gait not checked.  PSYCHIATRIC: The patient is somnolent, however, able to open his eyes briefly and answer questions yes and no inconsistently, not able to assess orientation  SKIN: No obvious rash, lesion, or ulcer.   ORDERS/RESULTS REVIEWED:   CBC  Recent Labs Lab 10/11/14 1127 10/12/14 0424  WBC 15.3* 12.7*  HGB 10.8* 10.4*  HCT 32.7* 30.5*  PLT 251 230  MCV 89.6 90.5  MCH 29.6 30.7  MCHC 33.1 33.9  RDW 13.1 13.2  LYMPHSABS 0.9*  --   MONOABS 0.7  --   EOSABS 0.0  --   BASOSABS 0.0  --    ------------------------------------------------------------------------------------------------------------------  Chemistries   Recent Labs Lab 10/11/14 1127 10/12/14 0424  NA 155* 158*  K 3.4* 3.5  CL 120* 126*  CO2 28 27  GLUCOSE 112* 173*  BUN 53* 51*  CREATININE 1.85* 1.60*  CALCIUM 8.4* 8.2*  AST 18  --   ALT 14*  --   ALKPHOS 93  --   BILITOT 1.6*  --    ------------------------------------------------------------------------------------------------------------------ estimated creatinine clearance is 31.6 mL/min (by C-G formula based on Cr of 1.6). ------------------------------------------------------------------------------------------------------------------ No results for input(s): TSH, T4TOTAL, T3FREE, THYROIDAB in the last 72 hours.  Invalid input(s): FREET3  Cardiac Enzymes No results for input(s): CKMB, TROPONINI, MYOGLOBIN in the last 168 hours.  Invalid input(s):  CK ------------------------------------------------------------------------------------------------------------------ Invalid input(s): POCBNP ---------------------------------------------------------------------------------------------------------------  RADIOLOGY: Ct Soft Tissue Neck W Contrast  10/11/2014   CLINICAL DATA:  Anterior neck swelling for 3 days.  EXAM: CT NECK WITH CONTRAST  TECHNIQUE: Multidetector CT imaging of the neck was performed using the standard protocol following the bolus administration of intravenous contrast.  CONTRAST:  60mL OMNIPAQUE IOHEXOL 300 MG/ML  SOLN  COMPARISON:  None.  FINDINGS: Pharynx and larynx: No definite abnormality seen. Epiglottis and larynx appear normal.  Salivary glands: Parotid and right submandibular glands appear normal. Left submandibular gland is enlarged with inflammatory stranding noted in the adjacent soft tissues.  Thyroid: Normal.  Lymph nodes: Mildly enlarged lymph nodes are noted in the left sub mandibular region with the largest measuring 8 mm.  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  Limited intracranial: No abnormality seen.  Visualized orbits: Visualized portions appear normal.  Mastoids and visualized paranasal sinuses: Visualized portions appear normal.  Skeleton: Severe degenerative disc disease is noted at C5-6 and C6-7.  Upper chest: No significant abnormality is noted in the visualized lung fields.  IMPRESSION: Left submandibular gland appears enlarged with inflammatory stranding in the adjacent soft tissues as well as mildly enlarged lymph nodes, consistent with inflammation of unknown etiology.  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   Electronically Signed   By: Lupita Raider, M.D.   On: 10/11/2014 13:23   Dg Chest Port 1 View  10/11/2014   CLINICAL DATA:  Sepsis.  Fever.  EXAM: PORTABLE CHEST - 1 VIEW  COMPARISON:  10/07/2013  FINDINGS: The heart size and mediastinal contours  are within normal limits. Both lungs are clear. The visualized skeletal structures are unremarkable.  IMPRESSION: No active disease.   Electronically Signed   By: Charlett Nose M.D.   On: 10/11/2014 11:16    EKG:  Orders placed or performed during the hospital encounter of 10/11/14  . ED EKG 12-Lead  . ED EKG 12-Lead    ASSESSMENT AND PLAN:  Active Problems:   Sialadenitis   Pressure ulcer 1. Acute left submandibular gland sialadenitis, continue Unasyn, steroids IV, blood cultures as well as wound cultures from submandibular gland is still pending, white blood cell count has improved 2. Metabolic encephalopathy due to infection, hypernatremia seems to be improving clinically. Follow supportive therapy 3. Leukocytosis, improving with antibiotic therapy. Follow tomorrow morning 4. CK D, stable with therapy 5. Hypernatremia, change IV fluids to D5 water and follow sodium level every 8 hours 6. Sepsis due to sialadenitis, blood cultures are pending. Continue antibiotics, IV   Management plans discussed with the patient, family and they are in agreement.   DRUG ALLERGIES:  Allergies  Allergen Reactions  . Codeine Other (See Comments)    UNK    CODE STATUS:     Code Status Orders        Start     Ordered   10/11/14 1649  Full code   Continuous     10/11/14 1648      TOTAL TIME TAKING CARE OF THIS PATIENT: 40 minutes.  Discussed extensively with nursing staff  VAICKUTE,RIMA M.D on 10/12/2014 at 10:45 AM  Between 7am to 6pm - Pager - [phone]  After 6pm go to [email or web address] - password [email or web address] [facility]  [facility]  Office  [phone]  CC: Primary care physician; No primary care provider on file.

## 2014-10-12 NOTE — Care Management (Signed)
Case being managed by CSW . Patient will return to SNF.

## 2014-10-12 NOTE — Progress Notes (Signed)
Notified Dr Winona Legato of critical value called by lab of positive culture (aeorbic positive for gram+ cocci in clusters); Dr acknowledged, ordered vancomycin IV 1G now, pharmacy to dose following

## 2014-10-12 NOTE — Progress Notes (Signed)
Initial Nutrition Assessment  DOCUMENTATION CODES:   Non-severe (moderate) malnutrition in context of chronic illness  INTERVENTION:  Meals and snacks: cater to pt prefernces Medical Nutrition Supplement Therapy: Will add mightyshake q daily, note magic cup ordered BID   NUTRITION DIAGNOSIS:   Inadequate oral intake related to mouth pain, acute illness as evidenced by meal completion < 25%.    GOAL:   Patient will meet greater than or equal to 90% of their needs    MONITOR:    (Energy intake, Electrolyte and renal profile, )  REASON FOR ASSESSMENT:   Malnutrition Screening Tool    ASSESSMENT:      Pt admitted with acute left submandibular sialadenitis  Past Medical History  Diagnosis Date  . Hypertension   . Lewy body dementia   . Parkinson's disease   . CKD (chronic kidney disease)   . TIA (transient ischemic attack)   . Hyperlipidemia   . Hip fracture     Current Nutrition: dysphagia 1 with nectar thick liquids per SLP. Per RN, Casimiro Needle poor po intake  Food/Nutrition-Related History: unable to answer questions, no family at bedside   Medications: dulcolax, decadron, D5 at with KCL, senokot, colace  Electrolyte/Renal Profile and Glucose Profile:   Recent Labs Lab 10/11/14 1127 10/12/14 0424 10/12/14 1228  NA 155* 158* 156*  K 3.4* 3.5  --   CL 120* 126*  --   CO2 28 27  --   BUN 53* 51*  --   CREATININE 1.85* 1.60*  --   CALCIUM 8.4* 8.2*  --   GLUCOSE 112* 173*  --    Protein Profile:  Recent Labs Lab 10/11/14 1127  ALBUMIN 2.9*    Gastrointestinal Profile: Last BM: PTA   Nutrition-Focused Physical Exam Findings: Nutrition-Focused physical exam completed. Findings are normal- mild/moderate fat depletion, normal to mild/moderate muscle depletion, and  Unable to assess edema.      Weight Change: 36% weight loss in the last 5 years per wt encounters. Pt unable to answer    Diet Order:  DIET - DYS 1 Room service  appropriate?: Yes; Fluid consistency:: Nectar Thick  Skin:   pressure ulcer stage II sacrum   Height:   Ht Readings from Last 1 Encounters:  10/11/14  (1.753 m)    Weight:   Wt Readings from Last 1 Encounters:  10/11/14 133 lb 9.6 oz (60.601 kg)         BMI:  Body mass index is 19.72 kg/(m^2).  Estimated Nutritional Needs:   Kcal:  Using IBW of 73kg BEE 1430 kcals (IF 1.1-1.3, AF 1.2) 8295-6213 kcals/d.   Protein:  Using IBW of 73kg (1.1-1.3 g/kg) 80-95 g/d  Fluid:  Using IBW of 73kg (30-74ml/kg) 2190-2560ml/d  EDUCATION NEEDS:   No education needs identified at this time  MODERATE Care Level  Dorann Davidson B. Freida Busman, RD, LDN [phone] (pager)

## 2014-10-12 NOTE — Progress Notes (Signed)
ANTIBIOTIC CONSULT NOTE - FOLLOW UP  Pharmacy Consult for Unasyn Indication: Sialadenitis   Allergies  Allergen Reactions  . Codeine Other (See Comments)    UNK    Patient Measurements: Height:  (175.3 cm) Weight: 133 lb 9.6 oz (60.601 kg) IBW/kg (Calculated) : 70.7   Vital Signs: Temp: 97.7 F (36.5 C) (09/21 0354) Temp Source: Oral (09/21 0354) BP: 132/60 mmHg (09/21 0354) Pulse Rate: 65 (09/21 0354) Intake/Output from previous day: 09/20 0701 - 09/21 0700 In: 1779 [I.V.:779] Out: 1650 [Urine:1650] Intake/Output from this shift: Total I/O In: 170 [I.V.:170] Out: 0   Labs:  Recent Labs  10/11/14 1127 10/12/14 0424  WBC 15.3* 12.7*  HGB 10.8* 10.4*  PLT 251 230  CREATININE 1.85* 1.60*   Estimated Creatinine Clearance: 31.6 mL/min (by C-G formula based on Cr of 1.6). No results for input(s): VANCOTROUGH, VANCOPEAK, VANCORANDOM, GENTTROUGH, GENTPEAK, GENTRANDOM, TOBRATROUGH, TOBRAPEAK, TOBRARND, AMIKACINPEAK, AMIKACINTROU, AMIKACIN in the last 72 hours.   Microbiology: Recent Results (from the past 720 hour(s))  Wound culture     Status: None (Preliminary result)   Collection Time: 10/11/14  5:36 PM  Result Value Ref Range Status   Specimen Description WOUND  Final   Special Requests NONE  Final   Gram Stain PENDING  Incomplete   Culture   Final    MODERATE GROWTH STAPHYLOCOCCUS AUREUS SUSCEPTIBILITIES TO FOLLOW    Report Status PENDING  Incomplete    Anti-infectives    Start     Dose/Rate Route Frequency Ordered Stop   10/12/14 1100  Ampicillin-Sulbactam (UNASYN) 3 g in sodium chloride 0.9 % 100 mL IVPB     3 g 100 mL/hr over 60 Minutes Intravenous Every 6 hours 10/12/14 1059     10/12/14 0000  Ampicillin-Sulbactam (UNASYN) 3 g in sodium chloride 0.9 % 100 mL IVPB  Status:  Discontinued     3 g 100 mL/hr over 60 Minutes Intravenous Every 12 hours 10/11/14 1722 10/12/14 1059   10/11/14 1900  fluconazole (DIFLUCAN) IVPB 100 mg     100 mg 50  mL/hr over 60 Minutes Intravenous Every 24 hours 10/11/14 1847     10/11/14 1100  Ampicillin-Sulbactam (UNASYN) 3 g in sodium chloride 0.9 % 100 mL IVPB     3 g 100 mL/hr over 60 Minutes Intravenous  Once 10/11/14 1051 10/11/14 1156      Assessment: 81 yo patient with Sialadenitis. Pharmacy consulted for Unasyn dosing and monitoring.  Patient started on Unasyn 3gm IV Q12 at 0000 on 9/21. He has received 1 dose, WBC improved from 15.3 to 12.7. Patient CrCl improved from 27.3 ml/min to 31.6 ml/min today.    Plan:  Will adjust patients Unasyn regimen to 3gm q6h based on CrCl improvement. Pharmacy will continue to monitor patients renal function and make adjustments as needed.  Scr ordered for 0500 tomorrow am.   Cher Nakai, PharmD Pharmacy Resident

## 2014-10-12 NOTE — Clinical Social Work Note (Signed)
Clinical Social Work Assessment  Patient Details  Name: Rick [name]. MRN: [id number] Date of Birth: 12/23/1931  Date of referral:  10/12/14               Reason for consult:  Facility Placement                Permission sought to share information with:    Permission granted to share information::     Name::        Agency::     Relationship::     Contact Information:     Housing/Transportation Living arrangements for the past 2 months:  [place] of Information:  Adult Children Patient Interpreter Needed:  None Criminal Activity/Legal Involvement Pertinent to Current Situation/Hospitalization:  No - Comment as needed Significant Relationships:  Adult Children Lives with:  Facility Resident Do you feel safe going back to the place where you live?  No Need for family participation in patient care:  Yes (Comment)  Care giving concerns:  Patient's son: Ollen Gross: [phone] requested to see CSW due to concerns he has over patient's care at [facility].   Social Worker assessment / plan:  CSW met with patient's son this morning and he stated that patient has been at [facility] for a year and that the care patient has received has constantly gone downhill and has been especially poor these past couple of months. Patient's son stated that they allegedly left patient's dentures in his mouth for weeks and thus causing an abscess. Patient's son has requested a new bedsearch but understands that patient may not have a bed offer in [facility] as patient is a long term care patient. If this occurs, patient's son has stated he does not want to go out of county. New bedsearch initiated.   Employment status:  Retired Nurse, adult PT Recommendations:  Not assessed at this time Information / Referral to community resources:  [place]  Patient/Family's Response to care:  Patient's son verbalized appreciation for  CSW assistance.  Patient/Family's Understanding of and Emotional Response to Diagnosis, Current Treatment, and Prognosis:  Patient's son verbalized frustration with the current SNF facility and for patient's safety at the current SNF.  Emotional Assessment Appearance:  Appears stated age Attitude/Demeanor/Rapport:  Unresponsive Affect (typically observed):    Orientation:  Fluctuating Orientation (Suspected and/or reported Sundowners) Alcohol / Substance use:  Not Applicable Psych involvement (Current and /or in the community):  No (Comment)  Discharge Needs  Concerns to be addressed:  Care Coordination Readmission within the last 30 days:  No Current discharge risk:  None Barriers to Discharge:  No Barriers Identified   Shela Leff, [place] 10/12/2014, 1:11 PM

## 2014-10-12 NOTE — Progress Notes (Signed)
ANTIBIOTIC CONSULT NOTE - INITIAL  Pharmacy Consult for Vancomycin Indication: sialadenitis  Allergies  Allergen Reactions  . Codeine Other (See Comments)    UNK    Patient Measurements: Height:  (175.3 cm) Weight: 133 lb 9.6 oz (60.601 kg) IBW/kg (Calculated) : 70.7  Vital Signs: Temp: 97.7 F (36.5 C) (09/21 1316) Temp Source: Oral (09/21 1316) BP: 124/60 mmHg (09/21 1316) Pulse Rate: 72 (09/21 1316) Intake/Output from previous day: 09/20 0701 - 09/21 0700 In: 1779 [I.V.:779] Out: 1650 [Urine:1650] Intake/Output from this shift: Total I/O In: 361 [I.V.:361] Out: 0   Labs:  Recent Labs  10/11/14 1127 10/12/14 0424  WBC 15.3* 12.7*  HGB 10.8* 10.4*  PLT 251 230  CREATININE 1.85* 1.60*   Estimated Creatinine Clearance: 31.6 mL/min (by C-G formula based on Cr of 1.6). No results for input(s): VANCOTROUGH, VANCOPEAK, VANCORANDOM, GENTTROUGH, GENTPEAK, GENTRANDOM, TOBRATROUGH, TOBRAPEAK, TOBRARND, AMIKACINPEAK, AMIKACINTROU, AMIKACIN in the last 72 hours.   Microbiology: Recent Results (from the past 720 hour(s))  Blood Culture (routine x 2)     Status: None (Preliminary result)   Collection Time: 10/11/14 11:27 AM  Result Value Ref Range Status   Specimen Description BLOOD LEFT ARM  Final   Special Requests BOTTLES DRAWN AEROBIC AND ANAEROBIC 2CC  Final   Culture  Setup Time   Final    GRAM POSITIVE COCCI IN CLUSTERS AEROBIC BOTTLE ONLY CRITICAL RESULT CALLED TO, READ BACK BY AND VERIFIED WITH: MICHAEL JACOBS AT 1543 10/12/14 CTJ    Culture   Final    GRAM POSITIVE COCCI IN CLUSTERS AEROBIC BOTTLE ONLY IDENTIFICATION TO FOLLOW    Report Status PENDING  Incomplete  Blood Culture (routine x 2)     Status: None (Preliminary result)   Collection Time: 10/11/14 11:27 AM  Result Value Ref Range Status   Specimen Description BLOOD RIGHT ARM  Final   Special Requests BOTTLES DRAWN AEROBIC AND ANAEROBIC 4CC  Final   Culture NO GROWTH 1 DAY  Final   Report  Status PENDING  Incomplete  Wound culture     Status: None (Preliminary result)   Collection Time: 10/11/14  5:36 PM  Result Value Ref Range Status   Specimen Description WOUND  Final   Special Requests NONE  Final   Gram Stain PENDING  Incomplete   Culture   Final    MODERATE GROWTH STAPHYLOCOCCUS AUREUS SUSCEPTIBILITIES TO FOLLOW    Report Status PENDING  Incomplete    Medical History: Past Medical History  Diagnosis Date  . Hypertension   . Lewy body dementia   . Parkinson's disease   . CKD (chronic kidney disease)   . TIA (transient ischemic attack)   . Hyperlipidemia   . Hip fracture     Medications:  Scheduled:  . ampicillin-sulbactam (UNASYN) IV  3 g Intravenous Q6H  . dexamethasone  4 mg Intravenous 4 times per day  . fluconazole (DIFLUCAN) IV  100 mg Intravenous Q24H  . heparin  5,000 Units Subcutaneous 3 times per day  . Influenza vac split quadrivalent PF  0.5 mL Intramuscular Tomorrow-1000  . vancomycin  1,000 mg Intravenous Once  . [START ON 10/13/2014] vancomycin  750 mg Intravenous Q24H   Assessment: 81 yo male admitted for sialadenitis.  Pharmacy consulted for vancomycin dosing.  Cultures grew staph aureus; sensitivities pending.  Goal of Therapy:  Vancomycin trough level 15-20 mcg/ml  Plan:  Pt received one dose vancomycin  IV on 0921 .   Will start vancomycin  IV  Q24H ~16 hours after first dose for stacked dosing.  Trough prior to 4th dose. Follow up culture results and discontinue if not MRSA - pt has unasyn as well.   Jacqualyn Posey, PharmD, Clinical Pharmacist 10/12/2014,4:23 PM

## 2014-10-13 DIAGNOSIS — L89152 Pressure ulcer of sacral region, stage 2: Secondary | ICD-10-CM | POA: Diagnosis not present

## 2014-10-13 DIAGNOSIS — G3183 Dementia with Lewy bodies: Secondary | ICD-10-CM | POA: Diagnosis not present

## 2014-10-13 DIAGNOSIS — B9562 Methicillin resistant Staphylococcus aureus infection as the cause of diseases classified elsewhere: Secondary | ICD-10-CM | POA: Diagnosis not present

## 2014-10-13 DIAGNOSIS — E440 Moderate protein-calorie malnutrition: Secondary | ICD-10-CM | POA: Diagnosis not present

## 2014-10-13 DIAGNOSIS — E870 Hyperosmolality and hypernatremia: Secondary | ICD-10-CM | POA: Diagnosis not present

## 2014-10-13 DIAGNOSIS — B370 Candidal stomatitis: Secondary | ICD-10-CM | POA: Diagnosis not present

## 2014-10-13 DIAGNOSIS — K1121 Acute sialoadenitis: Secondary | ICD-10-CM | POA: Diagnosis not present

## 2014-10-13 DIAGNOSIS — G9341 Metabolic encephalopathy: Secondary | ICD-10-CM | POA: Diagnosis not present

## 2014-10-13 DIAGNOSIS — F0280 Dementia in other diseases classified elsewhere without behavioral disturbance: Secondary | ICD-10-CM | POA: Diagnosis not present

## 2014-10-13 LAB — BASIC METABOLIC PANEL
ANION GAP: 5 (ref 5–15)
BUN: 51 mg/dL — AB (ref 6–20)
CALCIUM: 8.2 mg/dL — AB (ref 8.9–10.3)
CO2: 26 mmol/L (ref 22–32)
CREATININE: 1.4 mg/dL — AB (ref 0.61–1.24)
Chloride: 120 mmol/L — ABNORMAL HIGH (ref 101–111)
GFR calc Af Amer: 53 mL/min — ABNORMAL LOW (ref >60)
GFR, EST NON AFRICAN AMERICAN: 46 mL/min — AB (ref >60)
GLUCOSE: 174 mg/dL — AB (ref 65–99)
Potassium: 3.8 mmol/L (ref 3.5–5.1)
Sodium: 151 mmol/L — ABNORMAL HIGH (ref 135–145)

## 2014-10-13 LAB — SODIUM
SODIUM: 152 mmol/L — AB (ref 135–145)
SODIUM: 150 mmol/L — AB (ref 135–145)
SODIUM: 149 mmol/L — AB (ref 135–145)

## 2014-10-13 LAB — CBC
HCT: 31.5 % — ABNORMAL LOW (ref 40.0–52.0)
Hemoglobin: 10.8 g/dL — ABNORMAL LOW (ref 13.0–18.0)
MCH: 30.6 pg (ref 26.0–34.0)
MCHC: 34.3 g/dL (ref 32.0–36.0)
MCV: 89.2 fL (ref 80.0–100.0)
PLATELETS: 229 10*3/uL (ref 150–440)
RBC: 3.53 MIL/uL — ABNORMAL LOW (ref 4.40–5.90)
RDW: 13.2 % (ref 11.5–14.5)
WBC: 15.2 10*3/uL — ABNORMAL HIGH (ref 3.8–10.6)

## 2014-10-13 MED ORDER — METHYLPREDNISOLONE 4 MG PO TBPK: 4.0000 mg | ORAL_TABLET | Freq: Four times a day (QID) | ORAL | Status: DC

## 2014-10-13 MED ORDER — METHYLPREDNISOLONE 4 MG PO TBPK
8.0000 mg | ORAL_TABLET | Freq: Every morning | ORAL | Status: AC
  Administered 2014-10-13: 8 mg via ORAL
  Filled 2014-10-13: qty 21

## 2014-10-13 MED ORDER — METHYLPREDNISOLONE 4 MG PO TBPK
4.0000 mg | ORAL_TABLET | ORAL | Status: AC
  Administered 2014-10-13: 4 mg via ORAL

## 2014-10-13 MED ORDER — METHYLPREDNISOLONE 4 MG PO TBPK
4.0000 mg | ORAL_TABLET | Freq: Three times a day (TID) | ORAL | Status: AC
  Administered 2014-10-14 (×3): 4 mg via ORAL

## 2014-10-13 MED ORDER — METHYLPREDNISOLONE 4 MG PO TBPK
8.0000 mg | ORAL_TABLET | Freq: Every evening | ORAL | Status: AC
  Administered 2014-10-13: 8 mg via ORAL

## 2014-10-13 MED ORDER — CLINDAMYCIN HCL 150 MG PO CAPS
300.0000 mg | ORAL_CAPSULE | Freq: Three times a day (TID) | ORAL | Status: DC
  Administered 2014-10-13 – 2014-10-14 (×3): 300 mg via ORAL
  Filled 2014-10-13 (×3): qty 2

## 2014-10-13 MED ORDER — METHYLPREDNISOLONE 4 MG PO TBPK
8.0000 mg | ORAL_TABLET | Freq: Every evening | ORAL | Status: AC
  Administered 2014-10-14: 8 mg via ORAL

## 2014-10-13 NOTE — Clinical Social Work Note (Signed)
Patient has had a bed offer from [facility] and I have left a message for patient's son to inform him of this. Waiting to hear back from [facility] and all other facilities said they were unable to take patient.  York Spaniel MSW,LCSW [phone]

## 2014-10-13 NOTE — Progress Notes (Signed)
Late entry of gcodes by this therapist.  Katherine Watson, MS, CCC-SLP  

## 2014-10-13 NOTE — Progress Notes (Signed)
[facility] - [place] at [facility]   PATIENT NAME: Rick [name]    MR#:  [id number]  DATE OF BIRTH:  [birth year]  SUBJECTIVE: Unable to provide due to altered mental status  CHIEF COMPLAINT:   Chief Complaint  Patient presents with  . Fever  . Oral Swelling   patient is 81 year old male with history of dementia, Parkinson's disease. CK D, hypertension, who is a resident of [facility] skilled nursing facility, was brought to the hospital with lethargy, fever as well as left-sided neck swelling. He was noted to have elevated white blood cell count of 15.3. He was initiated on antibiotic Unasyn and ENT consultation was requested. Dr. Jenne Campus saw patient in consultation and felt that patient had left-sided mandible gland inflammation, obtained pus for cultures and recommended antibiotics and steroids IV. Patient remains very sleepy, although opens his eyes, does not converse. Swelling in left neck area is subsiding. Blood cultures 1 were positive for gram-positive cocci which came back as coagulase negative staph, likely contamination, patient was briefly on vancomycin, which is now discontinued.   Review of Systems  Unable to perform ROS: medical condition    VITAL SIGNS: Blood pressure 134/59, pulse 65, temperature 98.2 F (36.8 C), temperature source Oral, resp. rate 16, height  (1.753 m), weight 60.601 kg (133 lb 9.6 oz), SpO2 97 %.  PHYSICAL EXAMINATION:   GENERAL:  81 y.o.-year-old patient lying in the bed with no acute distress, comfortable on the bed. Able to open eyes but does not converse.  EYES: Pupils equal, round, reactive to light and accommodation. No scleral icterus. Extraocular muscles intact.  HEENT: Head atraumatic, normocephalic. Oropharynx and nasopharynx clear.  NECK:  Supple, no jugular venous distention. No thyroid enlargement, no tenderness. Swollen submandibular gland was noted on the left, but no significant tenderness was  noted on palpation, swelling has much improved LUNGS: Diminished breath sounds bilaterally, no wheezing, rales,rhonchi or crepitation. No use of accessory muscles of respiration.  CARDIOVASCULAR: S1, S2 normal. No murmurs, rubs, or gallops.  ABDOMEN: Soft, nontender, nondistended. Bowel sounds present. No organomegaly or mass.  EXTREMITIES: No pedal edema, cyanosis, or clubbing.  NEUROLOGIC: Cranial nerves grossly II through XII are intact. Muscle strength , unable to evaluate in all extremities. Due to poor mental status. Sensation not evaluated. Gait not checked.  PSYCHIATRIC: The patient is somnolent, however, able to open his eyes briefly  SKIN: No obvious rash, lesion, or ulcer.   ORDERS/RESULTS REVIEWED:   CBC  Recent Labs Lab 10/11/14 1127 10/12/14 0424 10/13/14 0353  WBC 15.3* 12.7* 15.2*  HGB 10.8* 10.4* 10.8*  HCT 32.7* 30.5* 31.5*  PLT 251 230 229  MCV 89.6 90.5 89.2  MCH 29.6 30.7 30.6  MCHC 33.1 33.9 34.3  RDW 13.1 13.2 13.2  LYMPHSABS 0.9*  --   --   MONOABS 0.7  --   --   EOSABS 0.0  --   --   BASOSABS 0.0  --   --    ------------------------------------------------------------------------------------------------------------------  Chemistries   Recent Labs Lab 10/11/14 1127 10/12/14 0424 10/12/14 1228 10/12/14 1638 10/12/14 2041 10/13/14 0353 10/13/14 0816  NA 155* 158* 156* 151* 149* 151*  152* 150*  K 3.4* 3.5  --   --   --  3.8  --   CL 120* 126*  --   --   --  120*  --   CO2 28 27  --   --   --  26  --  GLUCOSE 112* 173*  --   --   --  174*  --   BUN 53* 51*  --   --   --  51*  --   CREATININE 1.85* 1.60*  --   --   --  1.40*  --   CALCIUM 8.4* 8.2*  --   --   --  8.2*  --   AST 18  --   --   --   --   --   --   ALT 14*  --   --   --   --   --   --   ALKPHOS 93  --   --   --   --   --   --   BILITOT 1.6*  --   --   --   --   --   --     ------------------------------------------------------------------------------------------------------------------ estimated creatinine clearance is 36.1 mL/min (by C-G formula based on Cr of 1.4). ------------------------------------------------------------------------------------------------------------------ No results for input(s): TSH, T4TOTAL, T3FREE, THYROIDAB in the last 72 hours.  Invalid input(s): FREET3  Cardiac Enzymes No results for input(s): CKMB, TROPONINI, MYOGLOBIN in the last 168 hours.  Invalid input(s): CK ------------------------------------------------------------------------------------------------------------------ Invalid input(s): POCBNP ---------------------------------------------------------------------------------------------------------------  RADIOLOGY: Ct Soft Tissue Neck W Contrast  10/11/2014   CLINICAL DATA:  Anterior neck swelling for 3 days.  EXAM: CT NECK WITH CONTRAST  TECHNIQUE: Multidetector CT imaging of the neck was performed using the standard protocol following the bolus administration of intravenous contrast.  CONTRAST:  60mL OMNIPAQUE IOHEXOL 300 MG/ML  SOLN  COMPARISON:  None.  FINDINGS: Pharynx and larynx: No definite abnormality seen. Epiglottis and larynx appear normal.  Salivary glands: Parotid and right submandibular glands appear normal. Left submandibular gland is enlarged with inflammatory stranding noted in the adjacent soft tissues.  Thyroid: Normal.  Lymph nodes: Mildly enlarged lymph nodes are noted in the left sub mandibular region with the largest measuring 8 mm.  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  Limited intracranial: No abnormality seen.  Visualized orbits: Visualized portions appear normal.  Mastoids and visualized paranasal sinuses: Visualized portions appear normal.  Skeleton: Severe degenerative disc disease is noted at C5-6 and C6-7.  Upper chest: No significant abnormality is noted in the  visualized lung fields.  IMPRESSION: Left submandibular gland appears enlarged with inflammatory stranding in the adjacent soft tissues as well as mildly enlarged lymph nodes, consistent with inflammation of unknown etiology.  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   Electronically Signed   By: Lupita Raider, M.D.   On: 10/11/2014 13:23    EKG:  Orders placed or performed during the hospital encounter of 10/11/14  . ED EKG 12-Lead  . ED EKG 12-Lead    ASSESSMENT AND PLAN:  Active Problems:   Sialadenitis   Pressure ulcer   Malnutrition of moderate degree 1. Acute left submandibular gland sialadenitis, continue Unasyn, taper steroids orally, blood cultures revealed coagulase negative staph which is likely contamination , now off vancomycin , wound cultures from submandibular gland reveals moderate growth of Staphylococcus aureus, susceptibilities to follow, white blood cell count has worsened today, follow tomorrow morning 2. Metabolic encephalopathy due to infection, hypernatremia . Following, supportive therapy. Unfortunately, I do not see too much of significant improvement clinically, likely due to underlying dementia.  3. Leukocytosis, initially improved with antibiotic therapy, however, it's worse today, possibly steroid related. Follow tomorrow morning 4. CK D, stable with therapy  5. Hypernatremia, continue  D5 water and follow sodium level every 8 hours, 150 today, which is significant improvement from 158 yesterday, continue to follow 6. We will is negative Staphylococcus bacteremia, felt to be contamination , repeat blood cultures  Management plans discussed with the patient, family and they are in agreement.   DRUG ALLERGIES:  Allergies  Allergen Reactions  . Codeine Other (See Comments)    UNK    CODE STATUS:     Code Status Orders        Start     Ordered   10/11/14 1649  Full code   Continuous     10/11/14 1648      TOTAL TIME  TAKING CARE OF THIS PATIENT: 40 minutes.  Discussed extensively with nursing staff  VAICKUTE,RIMA M.D on 10/13/2014 at 11:18 AM  Between 7am to 6pm - Pager - [phone]  After 6pm go to [email or web address] - password [email or web address] [facility]  [facility] [place] [facility]  Office  [phone]  CC: Primary care physician; No primary care provider on file.

## 2014-10-13 NOTE — Progress Notes (Signed)
Speech Language Pathology Treatment: Dysphagia  Patient Details Name: Rick ZAHRADNIK Sr. MRN: [id number] DOB: 1931/02/01 Today's Date: 10/13/2014 Time: 4098-1191 SLP Time Calculation (min) (ACUTE ONLY): 35 min  Assessment / Plan / Recommendation Clinical Impression  Pt appears to be tolerating the current Dys. 1 w/ Nectar liquids diet recommended at this time; strict aspiration precautions and meds in puree - crushed as able. Pt requires feeding at all meals and verbal/tactile cues as he tends to be sleepy and need encouragement and redirection during meals d/t declined Cognitive status baseline. Pt continues to present w/ increased risk for aspiration and would benefit from a modified diet w/ monitoring for toleration by ST services and further education for staff and family. NSG updated. Family member spoken to as well.    HPI Other Pertinent Information: Rick [name] is a 81 y.o. male with a known history of Lewy body dementia, Parkinson's disease, CK D and hypertension, who has been a resident of [facility] skilled nursing facility was brought in secondary to lethargy and fevers. Patient, due to his dementia, does not interact much according to family. His daughter-in-law is at bedside and provides most of the history. Patient is lethargic on exam. His mouth is open and very dry. According to daughter-in-law, his dementia has progressively gotten worse in the last few months. His speech is not understandable at baseline. He does not have dysphagia according to her. Pt has been seen by ENT d/t increased left neck swelling. Pt has tolerated his Dys. 1 diet w/ Nectar liquids per NSG since yesterday's BSE. Pt remains sleepy and confused; requires feeding.   Pertinent Vitals Pain Assessment: No/denies pain  SLP Plan  Continue with current plan of care    Recommendations Diet recommendations: Dysphagia 1 (puree);Nectar-thick liquid Liquids provided via: Teaspoon;Cup;Straw (as long as no  coughing noted) Medication Administration: Crushed with puree Supervision: Full supervision/cueing for compensatory strategies;Trained caregiver to feed patient Compensations: Minimize environmental distractions;Slow rate;Small sips/bites;Check for pocketing;Follow solids with liquid Postural Changes and/or Swallow Maneuvers: Seated upright 90 degrees              General recommendations:  (Dietician f/u) Oral Care Recommendations: Oral care QID;Oral care before and after PO;Staff/trained caregiver to provide oral care Follow up Recommendations: Skilled Nursing facility Plan: Continue with current plan of care    GO    Jerilynn Som, MS, CCC-SLP  Watson,Katherine 10/13/2014, 3:35 PM

## 2014-10-13 NOTE — Progress Notes (Signed)
Physical Therapy Treatment Patient Details Name: Rick CAMPILLO Sr. MRN: [id number] DOB: 11/12/1931 Today's Date: 10/13/2014    History of Present Illness 81 yo male with onset of inflammation of L submandibular gland and discovered transverse aortic arch aneurysm was admitted, clear chest CT scan.    PT Comments    Pt's LE's appearing wind-swept to R upon entering room and limited ROM of L hip ER and R hip IR noted.  Pt refusing OOB but agreeable to in bed ex's.  Pt intermittently conversing with PT but not consistently answering therapist or participating in LE ex's.  Question pt's prior level of function.  Unable to get hold of care management to ask PLOF; nursing reporting PLOF unknown; and pt's ex-wife who was present reported she did not know pt's PLOF.  Will need to obtain pt's PLOF for appropriate PT recommendations and POC.   Follow Up Recommendations  Other (comment) (SNF vs LTC pending PLOF)     Equipment Recommendations       Recommendations for Other Services       Precautions / Restrictions Precautions Precautions: Fall Restrictions Weight Bearing Restrictions: No    Mobility  Bed Mobility               General bed mobility comments: Pt declined; question PLOF  Transfers                    Ambulation/Gait                 Stairs            Wheelchair Mobility    Modified Rankin (Stroke Patients Only)       Balance                                    Cognition Arousal/Alertness: Awake/alert Behavior During Therapy: Flat affect Overall Cognitive Status: Difficult to assess                      Exercises   Performed semi-supine B LE therapeutic exercise x 10 reps:  Ankle pumps (AAROM B LE's); SAQ's (AAROM R; AAROM L); heelslides (AAROM R; AAROM L).  Gentle stretching x30 seconds each:  L hip stretching into ER; R hip stretching into IR; B heelcord stretching.  Pt required vc's and tactile cues for  correct technique with exercises.     General Comments   Nursing cleared pt for participation in physical therapy.  Pt agreeable to PT session.  Pt's ex-wife present during session.       Pertinent Vitals/Pain Pain Assessment: No/denies pain  Vitals stable and WFL throughout treatment session.    Home Living                      Prior Function            PT Goals (current goals can now be found in the care plan section) Acute Rehab PT Goals Patient Stated Goal: pt did not state goals when asked PT Goal Formulation: Patient unable to participate in goal setting Progress towards PT goals: PT to reassess next treatment    Frequency  Min 2X/week    PT Plan Other (comment) (Will continue with current plan until PLOF obtained)    Co-evaluation             End of Session   Activity  Tolerance: Patient limited by fatigue;Patient limited by lethargy Patient left: in bed;with call bell/phone within reach;with bed alarm set;with family/visitor present     Time: 2956-2130 PT Time Calculation (min) (ACUTE ONLY): 15 min  Charges:  $Therapeutic Exercise: 8-22 mins                    G CodesHendricks [name] 11-03-14, 3:11 PM Rick [name], PT [phone]

## 2014-10-13 NOTE — Progress Notes (Signed)
Late entry of charges by this clinician.  Katherine Watson, MS, CCC-SLP  

## 2014-10-13 NOTE — Progress Notes (Addendum)
ANTIBIOTIC CONSULT NOTE - FOLLOW UP  Pharmacy Consult for Unasyn Indication: Sialadenitis   Allergies  Allergen Reactions  . Codeine Other (See Comments)    UNK    Patient Measurements: Height:  (175.3 cm) Weight: 133 lb 9.6 oz (60.601 kg) IBW/kg (Calculated) : 70.7   Vital Signs: Temp: 98.2 F (36.8 C) (09/22 0936) Temp Source: Oral (09/22 0936) BP: 134/59 mmHg (09/22 0936) Pulse Rate: 65 (09/22 0936) Intake/Output from previous day: 09/21 0701 - 09/22 0700 In: 1885 [I.V.:1785; IV Piggyback:100] Out: 800 [Urine:800] Intake/Output from this shift: Total I/O In: 547 [I.V.:547] Out: -   Labs:  Recent Labs  10/11/14 1127 10/12/14 0424 10/13/14 0353  WBC 15.3* 12.7* 15.2*  HGB 10.8* 10.4* 10.8*  PLT 251 230 229  CREATININE 1.85* 1.60* 1.40*   Estimated Creatinine Clearance: 36.1 mL/min (by C-G formula based on Cr of 1.4). No results for input(s): VANCOTROUGH, VANCOPEAK, VANCORANDOM, GENTTROUGH, GENTPEAK, GENTRANDOM, TOBRATROUGH, TOBRAPEAK, TOBRARND, AMIKACINPEAK, AMIKACINTROU, AMIKACIN in the last 72 hours.   Microbiology: Recent Results (from the past 720 hour(s))  Blood Culture (routine x 2)     Status: None (Preliminary result)   Collection Time: 10/11/14 11:27 AM  Result Value Ref Range Status   Specimen Description BLOOD LEFT ARM  Final   Special Requests BOTTLES DRAWN AEROBIC AND ANAEROBIC 2CC  Final   Culture  Setup Time   Final    GRAM POSITIVE COCCI IN CLUSTERS AEROBIC BOTTLE ONLY CRITICAL RESULT CALLED TO, READ BACK BY AND VERIFIED WITH: MICHAEL JACOBS AT 1543 10/12/14 CTJ    Culture   Final    COAGULASE NEGATIVE STAPHYLOCOCCUS AEROBIC BOTTLE ONLY Results consistent with contamination.    Report Status PENDING  Incomplete  Blood Culture (routine x 2)     Status: None (Preliminary result)   Collection Time: 10/11/14 11:27 AM  Result Value Ref Range Status   Specimen Description BLOOD RIGHT ARM  Final   Special Requests BOTTLES DRAWN  AEROBIC AND ANAEROBIC 4CC  Final   Culture NO GROWTH 1 DAY  Final   Report Status PENDING  Incomplete  Wound culture     Status: None (Preliminary result)   Collection Time: 10/11/14  5:36 PM  Result Value Ref Range Status   Specimen Description WOUND  Final   Special Requests NONE  Final   Gram Stain PENDING  Incomplete   Culture   Final    MODERATE GROWTH STAPHYLOCOCCUS AUREUS SUSCEPTIBILITIES TO FOLLOW    Report Status PENDING  Incomplete    Anti-infectives    Start     Dose/Rate Route Frequency Ordered Stop   10/13/14 0800  vancomycin (VANCOCIN) IVPB 750 mg/150 ml premix  Status:  Discontinued     750 mg 150 mL/hr over 60 Minutes Intravenous Every 24 hours 10/12/14 1622 10/13/14 0942   10/12/14 1600  vancomycin (VANCOCIN) IVPB 1000 mg/200 mL premix     1,000 mg 200 mL/hr over 60 Minutes Intravenous  Once 10/12/14 1557 10/12/14 1747   10/12/14 1100  Ampicillin-Sulbactam (UNASYN) 3 g in sodium chloride 0.9 % 100 mL IVPB     3 g 100 mL/hr over 60 Minutes Intravenous Every 6 hours 10/12/14 1059     10/12/14 0000  Ampicillin-Sulbactam (UNASYN) 3 g in sodium chloride 0.9 % 100 mL IVPB  Status:  Discontinued     3 g 100 mL/hr over 60 Minutes Intravenous Every 12 hours 10/11/14 1722 10/12/14 1059   10/11/14 1900  fluconazole (DIFLUCAN) IVPB 100 mg  100 mg 50 mL/hr over 60 Minutes Intravenous Every 24 hours 10/11/14 1847     10/11/14 1100  Ampicillin-Sulbactam (UNASYN) 3 g in sodium chloride 0.9 % 100 mL IVPB     3 g 100 mL/hr over 60 Minutes Intravenous  Once 10/11/14 1051 10/11/14 1156      Assessment: 81 yo patient with Sialadenitis. Pharmacy consulted for Unasyn dosing and monitoring.  Patient started on Unasyn 3gm IV Q12 at 0000 on 9/21.WBC  Elevated at 15.2 (up from 12.7) but patient was started on decamethasone IV QID yesterday. CrCl improved from 31.6 ml/min to 36.1 (Scr:1.4). Vancomycin and vanc consult has been D/Cd by Dr. Winona Legato.   Plan: Will continue patient on  Unasyn regimen of 3gm q6h. Pharmacy will continue to monitor CrCl, Scr and cultures.    Cher Nakai, PharmD Pharmacy Resident

## 2014-10-14 DIAGNOSIS — Z515 Encounter for palliative care: Secondary | ICD-10-CM

## 2014-10-14 DIAGNOSIS — K1120 Sialoadenitis, unspecified: Secondary | ICD-10-CM

## 2014-10-14 DIAGNOSIS — F0390 Unspecified dementia without behavioral disturbance: Secondary | ICD-10-CM

## 2014-10-14 LAB — BASIC METABOLIC PANEL
Anion gap: 5 (ref 5–15)
BUN: 42 mg/dL — AB (ref 6–20)
CALCIUM: 7.9 mg/dL — AB (ref 8.9–10.3)
CO2: 25 mmol/L (ref 22–32)
CREATININE: 1.16 mg/dL (ref 0.61–1.24)
Chloride: 115 mmol/L — ABNORMAL HIGH (ref 101–111)
GFR calc Af Amer: >60 mL/min (ref >60)
GFR calc non Af Amer: 58 mL/min — ABNORMAL LOW (ref >60)
GLUCOSE: 145 mg/dL — AB (ref 65–99)
Potassium: 4.3 mmol/L (ref 3.5–5.1)
Sodium: 145 mmol/L (ref 135–145)

## 2014-10-14 LAB — CBC
HCT: 31.1 % — ABNORMAL LOW (ref 40.0–52.0)
HEMOGLOBIN: 10.5 g/dL — AB (ref 13.0–18.0)
MCH: 29.6 pg (ref 26.0–34.0)
MCHC: 33.8 g/dL (ref 32.0–36.0)
MCV: 87.7 fL (ref 80.0–100.0)
PLATELETS: 223 10*3/uL (ref 150–440)
RBC: 3.54 MIL/uL — ABNORMAL LOW (ref 4.40–5.90)
RDW: 12.8 % (ref 11.5–14.5)
WBC: 11.3 10*3/uL — ABNORMAL HIGH (ref 3.8–10.6)

## 2014-10-14 MED ORDER — SULFAMETHOXAZOLE-TRIMETHOPRIM 800-160 MG PO TABS
1.0000 | ORAL_TABLET | Freq: Two times a day (BID) | ORAL | Status: DC
  Administered 2014-10-14 (×2): 1 via ORAL
  Filled 2014-10-14 (×2): qty 1

## 2014-10-14 MED ORDER — DEXTROSE 5 % IV SOLN
INTRAVENOUS | Status: AC
  Administered 2014-10-14 – 2014-10-15 (×3): via INTRAVENOUS

## 2014-10-14 NOTE — Clinical Social Work Note (Signed)
Patient's son, Britt Bottom, returned my call today and stated he was not sure if he wanted patient to accept the [facility] bed offer and stated he was going to have to think about things. Currently the choice would be returning to [facility] versus going to [facility]. Patient would be entering either facility under his medicaid and both facilities are aware of this. Amy at [facility] left CSW a message stating with patient's current status, patient would not be skillable and would have to go back into a SNF under medicaid. York Spaniel MSW,LCSW [phone]

## 2014-10-14 NOTE — Progress Notes (Signed)
[facility] - [place] at [facility]   PATIENT NAME: Rick [name]    MR#:  [id number]  DATE OF BIRTH:  July 13, 1931  SUBJECTIVE: Unable to provide due to altered mental status  CHIEF COMPLAINT:   Chief Complaint  Patient presents with  . Fever  . Oral Swelling   patient is 81 year old male with history of dementia, Parkinson's disease. CK D, hypertension, who is a resident of [facility] skilled nursing facility, was brought to the hospital with lethargy, fever as well as left-sided neck swelling. He was noted to have elevated white blood cell count of 15.3. He was initiated on antibiotic Unasyn and ENT consultation was requested. Dr. Jenne Campus saw patient in consultation and felt that patient had left-sided mandible gland inflammation, obtained pus for cultures and recommended antibiotics and steroids IV. Patient remains very sleepy, although opens his eyes, does not converse. Swelling in left neck area is subsiding. Blood cultures 1 were positive for gram-positive cocci which came back as coagulase negative staph, likely contamination, patient was briefly on vancomycin, which is now discontinued. Patient is on clindamycin orally at present. Repeated blood cultures 10/13/2014  Ordered, not reported. She remains somewhat somnolent, although much more arousable and able to communicate some. Still not eating well. Unclear baseline. Attempted to reach patient's son, and left 2 messages. Extensive discussion about discharge planning with care management, social worker today. Apparently patient's and did not return call to social work about discharge planning as well.    Review of Systems  Unable to perform ROS: medical condition    VITAL SIGNS: Blood pressure 178/74, pulse 50, temperature 97.7 F (36.5 C), temperature source Oral, resp. rate 16, height  (1.753 m), weight 60.601 kg (133 lb 9.6 oz), SpO2 88 %.  PHYSICAL EXAMINATION:   GENERAL:  81 y.o.-year-old  patient lying in the bed with no acute distress, comfortable on the bed. Able to open eyes versus briefly answers yes and no . Intermittently follows commands EYES: Pupils equal, round, reactive to light and accommodation. No scleral icterus. Extraocular muscles intact.  HEENT: Head atraumatic, normocephalic. Oropharynx and nasopharynx clear.  NECK:  Supple, no jugular venous distention. No thyroid enlargement, no tenderness. Mildly swollen submandibular gland was noted on the left, but no significant tenderness was noted on palpation, swelling has much improved since admission LUNGS: Better air entrance  bilaterally, no wheezing, rales,rhonchi or crepitation. No use of accessory muscles of respiration.  CARDIOVASCULAR: S1, S2 normal. No murmurs, rubs, or gallops.  ABDOMEN: Soft, nontender, nondistended. Bowel sounds present. No organomegaly or mass.  EXTREMITIES: No pedal edema, cyanosis, or clubbing.  NEUROLOGIC: Cranial nerves grossly II through XII are intact. Muscle strength , unable to evaluate in all extremities. Due to poor mental status. Sensation not evaluated. Gait not checked.  PSYCHIATRIC: The patient is somnolent, however, able to open his eyes briefly  SKIN: No obvious rash, lesion, or ulcer.   ORDERS/RESULTS REVIEWED:   CBC  Recent Labs Lab 10/11/14 1127 10/12/14 0424 10/13/14 0353 10/14/14 0700  WBC 15.3* 12.7* 15.2* 11.3*  HGB 10.8* 10.4* 10.8* 10.5*  HCT 32.7* 30.5* 31.5* 31.1*  PLT 251 230 229 223  MCV 89.6 90.5 89.2 87.7  MCH 29.6 30.7 30.6 29.6  MCHC 33.1 33.9 34.3 33.8  RDW 13.1 13.2 13.2 12.8  LYMPHSABS 0.9*  --   --   --   MONOABS 0.7  --   --   --   EOSABS 0.0  --   --   --  BASOSABS 0.0  --   --   --    ------------------------------------------------------------------------------------------------------------------  Chemistries   Recent Labs Lab 10/11/14 1127 10/12/14 0424  10/12/14 2041 10/13/14 0353 10/13/14 0816 10/13/14 1549  10/14/14 0700  NA 155* 158*  < > 149* 151*  152* 150* 149* 145  K 3.4* 3.5  --   --  3.8  --   --  4.3  CL 120* 126*  --   --  120*  --   --  115*  CO2 28 27  --   --  26  --   --  25  GLUCOSE 112* 173*  --   --  174*  --   --  145*  BUN 53* 51*  --   --  51*  --   --  42*  CREATININE 1.85* 1.60*  --   --  1.40*  --   --  1.16  CALCIUM 8.4* 8.2*  --   --  8.2*  --   --  7.9*  AST 18  --   --   --   --   --   --   --   ALT 14*  --   --   --   --   --   --   --   ALKPHOS 93  --   --   --   --   --   --   --   BILITOT 1.6*  --   --   --   --   --   --   --   < > = values in this interval not displayed. ------------------------------------------------------------------------------------------------------------------ estimated creatinine clearance is 43.5 mL/min (by C-G formula based on Cr of 1.16). ------------------------------------------------------------------------------------------------------------------ No results for input(s): TSH, T4TOTAL, T3FREE, THYROIDAB in the last 72 hours.  Invalid input(s): FREET3  Cardiac Enzymes No results for input(s): CKMB, TROPONINI, MYOGLOBIN in the last 168 hours.  Invalid input(s): CK ------------------------------------------------------------------------------------------------------------------ Invalid input(s): POCBNP ---------------------------------------------------------------------------------------------------------------  RADIOLOGY: No results found.  EKG:  Orders placed or performed during the hospital encounter of 10/11/14  . ED EKG 12-Lead  . ED EKG 12-Lead    ASSESSMENT AND PLAN:  Active Problems:   Sialadenitis   Pressure ulcer   Malnutrition of moderate degree 1. Acute left submandibular gland sialadenitis, change clindamycin to Septra DS due to bacteria - Klebsiella pneumonia reported in wound cultures, taper steroids orally, blood cultures revealed gram-positive cocci yesterday reported as coagulase negative staph,   which was felt to be likely contamination, although today I do not see blood culture reported as such , now off vancomycin , wound cultures from submandibular gland reveals moderate growth of Staphylococcus aureus and Klebsiella pneumonia, change clindamycin to Septra DS . Following culture results closely, awaiting for repeated blood cultures 2. Metabolic encephalopathy due to infection, hypernatremia . Somewhat improved, continue supportive therapy. Patient's oral intake remains very poor and clear baseline, getting palliative care consultation attempted to reach patient's son, and left 2 messages  3. Leukocytosis, improved with antibiotic therapy 4. CK D, stable, improved with therapy, although patient's presentation with renal insufficiency is concerning for possible underlying dehydration and inability to to maintain hydration status, questionable artificial feeding. Questionable PEG tube placement.  5. Hypernatremia, resolved on D5 water ,  significant improvement from 158 on admission, unfortunately patient's oral intake remains very poor , I suspect that patient will likely need to have artificial nutrition if patient's family decides including PEG tube placement , discontinue D5 water  infusion, continue to follow 6. Bacteremia, which is likely contamination , repeated blood cultures are pending  Management plans discussed with the patient, family and they are in agreement.   DRUG ALLERGIES:  Allergies  Allergen Reactions  . Codeine Other (See Comments)    UNK    CODE STATUS:     Code Status Orders        Start     Ordered   10/11/14 1649  Full code   Continuous     10/11/14 1648      TOTAL TIME TAKING CARE OF THIS PATIENT: 40 minutes.  Discussed extensively with nursing staff and care management, attempted to reach patient's son, as mentioned above. Time spent approximately 10-15 minutes for discharge planning  VAICKUTE,RIMA M.D on 10/14/2014 at 12:58 PM  Between 7am to 6pm  - Pager - [phone]  After 6pm go to [email or web address] - password [email or web address] [facility]  [place] [facility]  Office  [phone]  CC: Primary care physician; No primary care provider on file.

## 2014-10-14 NOTE — Consult Note (Signed)
Palliative Medicine Inpatient Consult Note   Name: Rick JEWKES Sr. Date: 10/14/2014 MRN: [id number]  DOB: 1931-05-26  Referring Physician: Katharina Caper, MD  Palliative Care consult requested for this 81 y.o. male for goals of medical therapy in patient with inflammation of salivary glands.  He has a dementia also and goals of care are desired since this could be a reversible problem OR it could be part of a larger picture of overall decline with dysphagia and decreased oral intake associated with advancing dementia.     IMPRESSION Primary Problems: Sialadenitis left submandibular salivary gland ---Use of Robinul 2 mg po bid at nursing facility for secretions is noted in his med list ---inadequate oral intake less than 25% ---ENT consult obtained from Dr. Jenne Campus who obtained pus for cx MRSA and KLEBSIELLA in wound from 9/20 ---currently on oral Septra NOT EATING WELL    Additional Problems: Essential HTN Lewy Body Dementia Parkinson's Dementia CKD H/O TIA Dyslipidemia H/o hip fracture Mobility and Gait Disorder due to dementia and Parkinsons Coag neg Staph in a blood cx --felt to be contamination Dyphagia --with pureed nectar thick diet ordered (must be fed) Anemia of unclear etiology Hyperglycemia associated with recent steroid Rx   _____________________________________________________________________  DISCUSSION/ PLAN Unfortunately, I was only able to see Rick [name] at a late hour today, due to a number of new consults requested for Palliative Care this afternoon.  Since it is Friday, I did not want to wait until Monday to see him, but I did not feel it was appropriate to call the son at this hour. I note where Dr. Harlow Ohms tried to reach the son twice and left messages, but he has not called back. Notes state that son is is not satisfied with current bed offers.  I have not called the son or daughter tonite.   Me sense is that pt is approaching an end stage of  life, but he could have some better days ahead once this sialadenitis is resolved. This does not mean he will thrive, however.  I think that his decline is documented but it might not be something the family is ready to acknowledge fully --especially in the setting of an acute infection that is a confounder in terms of knowing his baseline oral intake etc.   I would like to talk with the son --preferably in person and also in daytime ---to recommend a DNR and also consideration of a palliative approach to care or even a hospice consult. He would meet criteria for Hospice under the malnutrition category most likely.    I would like to find out about the 'oral abscess caused by dentures staying in too long' as this could be pertinent to his oral health/ disease status.  Mention of this in one note confused me a bit and I don 't know how long ago this took place etc.    Would continue current diet texture --but he must be fed. Would track feeding attempts as well as patient's response to feeding attempts.  His was not fed dinner tonight and its not clear if this was due to refusal or another reason.      I have discontinued Robinul at least for now.   Drooling is preferable to dry mouth in this gentleman with salivary gland dysfunction and dehydration as evidenced by his hypernatremia at admission.   Would recommend that he be hydrated well until the Septra RX  is finished and also that we  watch metabolic panels closely  due to the potential to worsen renal function especially in elderly frail patients at risk for dehydration. He has had ARF in past.  He may be discharged over the weekend --or not.  I would recommend a Pallaitive Care Consult in the facility if he is discharged over the weekend. If he is here on Monday, I will talk with family on that day about considering a more palliative approach to his overall care --once his infection is treated and he is back to baseline --possibly a new baseline.     ____________________________________________________________  REVIEW OF SYSTEMS:  Patient is not able to provide ROS due to dementia  SPIRITUAL SUPPORT SYSTEM: Yes --son.  SOCIAL HISTORY:  reports that he has never smoked. He does not have any smokeless tobacco history on file. He reports that he does not drink alcohol or use illicit drugs.  He has been a resident of [facility] for a year .  Son unhappy with this facility saying that his dentures were left in his mouth and this caused an oral abscess.   A bed was offered by [facility] but son is not sure he wants to accept this bed offer for his father.  Pt is not 'skillable' due to dementia and would need to go to a SNF under Medicaid long term care coverage for room and board.    LEGAL DOCUMENTS:  NONE IN PAPER CHART  CODE STATUS: Full code  PAST MEDICAL HISTORY: Past Medical History  Diagnosis Date  . Hypertension   . Lewy body dementia   . Parkinson's disease   . CKD (chronic kidney disease)   . TIA (transient ischemic attack)   . Hyperlipidemia   . Hip fracture     PAST SURGICAL HISTORY:  Past Surgical History  Procedure Laterality Date  . None      ALLERGIES:  is allergic to codeine.  MEDICATIONS:  Current Facility-Administered Medications  Medication Dose Route Frequency Provider Last Rate Last Dose  . acetaminophen (TYLENOL) tablet 650 mg  650 mg Oral Q6H PRN Enid Baas, MD       Or  . acetaminophen (TYLENOL) suppository 650 mg  650 mg Rectal Q6H PRN Enid Baas, MD      . albuterol (PROVENTIL) (2.5 MG/3ML) 0.083% nebulizer solution 2.5 mg  2.5 mg Nebulization Q2H PRN Enid Baas, MD      . bisacodyl (DULCOLAX) suppository 10 mg  10 mg Rectal Daily PRN Enid Baas, MD      . dextrose 5 % solution   Intravenous Continuous Katharina Caper, MD 125 mL/hr at 10/14/14 2023    . docusate sodium (COLACE) capsule 100 mg  100 mg Oral BID PRN Enid Baas, MD      . fluconazole  (DIFLUCAN) IVPB 100 mg  100 mg Intravenous Q24H Enid Baas, MD   100 mg at 10/14/14 2024  . heparin injection 5,000 Units  5,000 Units Subcutaneous 3 times per day Enid Baas, MD   5,000 Units at 10/14/14 1420  . hydrALAZINE (APRESOLINE) injection 10 mg  10 mg Intravenous Q6H PRN Enid Baas, MD      . Melene Muller ON 10/15/2014] methylPREDNISolone (MEDROL DOSEPAK) tablet 4 mg  4 mg Oral 4X daily taper Katharina Caper, MD      . methylPREDNISolone (MEDROL DOSEPAK) tablet 8 mg  8 mg Oral Nightly Katharina Caper, MD      . senna (SENOKOT) tablet 8.6 mg  1 tablet Oral Daily PRN Enid Baas, MD      .  sulfamethoxazole-trimethoprim (BACTRIM DS,SEPTRA DS) 800-160 MG per tablet 1 tablet  1 tablet Oral Q12H Katharina Caper, MD   1 tablet at 10/14/14 1144    Vital Signs: BP 178/74 mmHg  Pulse 50  Temp(Src) 97.7 F (36.5 C) (Oral)  Resp 16  Ht  (1.753 m)  Wt 60.601 kg (133 lb 9.6 oz)  BMI 19.72 kg/m2  SpO2 88% Filed Weights   10/11/14 1104 10/11/14 1620  Weight: 67.586 kg (149 lb) 60.601 kg (133 lb 9.6 oz)    Estimated body mass index is 19.72 kg/(m^2) as calculated from the following:   Height as of this encounter:  (1.753 m).   Weight as of this encounter: 60.601 kg (133 lb 9.6 oz).  PERFORMANCE STATUS (ECOG) : 4 - Bedbound  PHYSICAL EXAM: Lying in bed resting Confused  Enlarged left SM area hrt rrr no mgr Lungs cta anteriolaterally  No rales Abd soft and Nontender Ext thin atrophied muscles  LABS: CBC:    Component Value Date/Time   WBC 11.3* 10/14/2014 0700   WBC 9.1 10/07/2013 0813   HGB 10.5* 10/14/2014 0700   HGB 8.5* 10/07/2013 0813   HCT 31.1* 10/14/2014 0700   HCT 25.5* 10/07/2013 0813   PLT 223 10/14/2014 0700   PLT 270 10/07/2013 0813   MCV 87.7 10/14/2014 0700   MCV 91 10/07/2013 0813   NEUTROABS 13.7* 10/11/2014 1127   NEUTROABS 6.8* 10/07/2013 0813   LYMPHSABS 0.9* 10/11/2014 1127   LYMPHSABS 1.1 10/07/2013 0813   MONOABS 0.7  10/11/2014 1127   MONOABS 0.7 10/07/2013 0813   EOSABS 0.0 10/11/2014 1127   EOSABS 0.5 10/07/2013 0813   BASOSABS 0.0 10/11/2014 1127   BASOSABS 0.1 10/07/2013 0813   Comprehensive Metabolic Panel:    Component Value Date/Time   NA 145 10/14/2014 0700   NA 140 10/07/2013 0813   K 4.3 10/14/2014 0700   K 3.6 10/07/2013 0813   CL 115* 10/14/2014 0700   CL 108* 10/07/2013 0813   CO2 25 10/14/2014 0700   CO2 28 10/07/2013 0813   BUN 42* 10/14/2014 0700   BUN 27* 10/07/2013 0813   CREATININE 1.16 10/14/2014 0700   CREATININE 1.32* 10/07/2013 0813   GLUCOSE 145* 10/14/2014 0700   GLUCOSE 112* 10/07/2013 0813   CALCIUM 7.9* 10/14/2014 0700   CALCIUM 7.7* 10/07/2013 0813   AST 18 10/11/2014 1127   AST 31 09/30/2013 1020   ALT 14* 10/11/2014 1127   ALT 21 09/30/2013 1020   ALKPHOS 93 10/11/2014 1127   ALKPHOS 79 09/30/2013 1020   BILITOT 1.6* 10/11/2014 1127   BILITOT 1.4* 09/30/2013 1020   PROT 7.0 10/11/2014 1127   PROT 7.1 09/30/2013 1020   ALBUMIN 2.9* 10/11/2014 1127   ALBUMIN 3.2* 09/30/2013 1020    TESTS: CT soft tissue neck w cm 10/11/14: Left submandibular gland appears enlarged with inflammatory stranding in the adjacent soft tissues as well as mildly enlarged lymph nodes, consistent with inflammation of unknown etiology.  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               CXR 10/11/14 = negative ____________________________________________________________________  More than 50% of the visit was spent in counseling/coordination of care: Yes  Time Spent: 70 minutes

## 2014-10-14 NOTE — Progress Notes (Signed)
Speech Language Pathology Treatment: Dysphagia  Patient Details Name: Rick BRUCATO Sr. MRN: [id number] DOB: 03/12/1931 Today's Date: 10/14/2014 Time: 4098-1191 SLP Time Calculation (min) (ACUTE ONLY): 15 min  Assessment / Plan / Recommendation Clinical Impression  Pt appears to be tolerating the current Dys. 1 w/ Nectar liquids diet recommended at this time. No overt s/s of aspiration were observed w/puree and nectar trials, although pt only willing to take a few bites and sips. Strict aspiration precautions and meds in puree - crushed as able. Pt requires feeding at all meals and verbal/tactile cues as he tends to be sleepy and need encouragement and redirection during meals d/t declined Cognitive status baseline. Pt continues to present w/ increased risk for aspiration and would benefit from a modified diet w/ monitoring for toleration by ST services and further education for staff and family. Nsg also reports toleration of diet but decreased PO intake.    HPI Other Pertinent Information: Rick [name] is a 81 y.o. male with a known history of Lewy body dementia, Parkinson's disease, CK D and hypertension, who has been a resident of [facility] skilled nursing facility was brought in secondary to lethargy and fevers. Patient, due to his dementia, does not interact much according to family. His daughter-in-law is at bedside and provides most of the history. Patient is lethargic on exam. His mouth is open and very dry. According to daughter-in-law, his dementia has progressively gotten worse in the last few months. His speech is not understandable at baseline. He does not have dysphagia according to her. Pt has been seen by ENT d/t increased left neck swelling. Pt has tolerated his Dys. 1 diet w/ Nectar liquids per NSG since BSE. Pt remains sleepy and confused; requires feeding and encouragement to take PO's.    Pertinent Vitals Pain Assessment: No/denies pain  SLP Plan  Continue with  current plan of care    Recommendations Diet recommendations: Dysphagia 1 (puree);Nectar-thick liquid Liquids provided via: Teaspoon Medication Administration: Crushed with puree Supervision: Full supervision/cueing for compensatory strategies;Trained caregiver to feed patient Compensations: Minimize environmental distractions;Slow rate;Small sips/bites;Check for pocketing;Follow solids with liquid Postural Changes and/or Swallow Maneuvers: Seated upright 90 degrees              Oral Care Recommendations: Oral care QID;Oral care before and after PO;Staff/trained caregiver to provide oral care Follow up Recommendations: Skilled Nursing facility Plan: Continue with current plan of care    GO     Rick [name],Rick [name] 10/14/2014, 9:54 AM

## 2014-10-15 LAB — WOUND CULTURE

## 2014-10-15 LAB — BASIC METABOLIC PANEL
ANION GAP: 5 (ref 5–15)
BUN: 31 mg/dL — AB (ref 6–20)
CO2: 26 mmol/L (ref 22–32)
Calcium: 8 mg/dL — ABNORMAL LOW (ref 8.9–10.3)
Chloride: 108 mmol/L (ref 101–111)
Creatinine, Ser: 0.98 mg/dL (ref 0.61–1.24)
GFR calc Af Amer: >60 mL/min (ref >60)
Glucose, Bld: 132 mg/dL — ABNORMAL HIGH (ref 65–99)
POTASSIUM: 4.3 mmol/L (ref 3.5–5.1)
SODIUM: 139 mmol/L (ref 135–145)

## 2014-10-15 MED ORDER — VANCOMYCIN HCL IN DEXTROSE 1-5 GM/200ML-% IV SOLN
1000.0000 mg | INTRAVENOUS | Status: AC
  Administered 2014-10-15: 1000 mg via INTRAVENOUS
  Filled 2014-10-15: qty 200

## 2014-10-15 MED ORDER — METHYLPREDNISOLONE SODIUM SUCC 40 MG IJ SOLR
40.0000 mg | Freq: Every day | INTRAMUSCULAR | Status: DC
  Administered 2014-10-15 – 2014-10-17 (×3): 40 mg via INTRAVENOUS
  Filled 2014-10-15 (×3): qty 1

## 2014-10-15 MED ORDER — MEGESTROL ACETATE 40 MG/ML PO SUSP
400.0000 mg | Freq: Every day | ORAL | Status: DC
  Administered 2014-10-17 – 2014-10-18 (×2): 400 mg via ORAL
  Filled 2014-10-15 (×4): qty 10

## 2014-10-15 MED ORDER — VANCOMYCIN HCL IN DEXTROSE 1-5 GM/200ML-% IV SOLN
1000.0000 mg | INTRAVENOUS | Status: DC
  Administered 2014-10-16 (×2): 1000 mg via INTRAVENOUS
  Filled 2014-10-15 (×4): qty 200

## 2014-10-15 NOTE — Progress Notes (Addendum)
ANTIBIOTIC CONSULT NOTE - INITIAL  Pharmacy Consult for Vancomycin Indication: Sialadenitis  Allergies  Allergen Reactions  . Codeine Other (See Comments)    UNK    Patient Measurements: Height:  (175.3 cm) Weight: 133 lb 9.6 oz (60.601 kg) IBW/kg (Calculated) : 70.7 Adjusted Body Weight: 60.6 kg   Vital Signs: Temp: 97.8 F (36.6 C) (09/24 1329) Temp Source: Oral (09/24 1329) BP: 144/59 mmHg (09/24 1329) Pulse Rate: 47 (09/24 1329) Intake/Output from previous day: 09/23 0701 - 09/24 0700 In: 1859.4 [P.O.:600; I.V.:1259.4] Out: 2200 [Urine:2200] Intake/Output from this shift: Total I/O In: 436 [I.V.:436] Out: 700 [Urine:700]  Labs:  Recent Labs  10/13/14 0353 10/14/14 0700 10/15/14 0634  WBC 15.2* 11.3*  --   HGB 10.8* 10.5*  --   PLT 229 223  --   CREATININE 1.40* 1.16 0.98   Estimated Creatinine Clearance: 51.5 mL/min (by C-G formula based on Cr of 0.98). No results for input(s): VANCOTROUGH, VANCOPEAK, VANCORANDOM, GENTTROUGH, GENTPEAK, GENTRANDOM, TOBRATROUGH, TOBRAPEAK, TOBRARND, AMIKACINPEAK, AMIKACINTROU, AMIKACIN in the last 72 hours.   Microbiology: Recent Results (from the past 720 hour(s))  Blood Culture (routine x 2)     Status: None (Preliminary result)   Collection Time: 10/11/14 11:27 AM  Result Value Ref Range Status   Specimen Description BLOOD LEFT ARM  Final   Special Requests BOTTLES DRAWN AEROBIC AND ANAEROBIC 2CC  Final   Culture  Setup Time   Final    GRAM POSITIVE COCCI IN CLUSTERS AEROBIC BOTTLE ONLY CRITICAL RESULT CALLED TO, READ BACK BY AND VERIFIED WITH: MICHAEL JACOBS AT 1543 10/12/14 CTJ    Culture   Final    COAGULASE NEGATIVE STAPHYLOCOCCUS AEROBIC BOTTLE ONLY Results consistent with contamination.    Report Status PENDING  Incomplete  Blood Culture (routine x 2)     Status: None (Preliminary result)   Collection Time: 10/11/14 11:27 AM  Result Value Ref Range Status   Specimen Description BLOOD RIGHT ARM  Final    Special Requests BOTTLES DRAWN AEROBIC AND ANAEROBIC 4CC  Final   Culture NO GROWTH 2 DAYS  Final   Report Status PENDING  Incomplete  Wound culture     Status: None   Collection Time: 10/11/14  5:36 PM  Result Value Ref Range Status   Specimen Description WOUND  Final   Special Requests NONE  Final   Gram Stain   Final    MODERATE WBC SEEN RARE GRAM NEGATIVE RODS RARE GRAM POSITIVE COCCI IN CLUSTERS    Culture   Final    MODERATE GROWTH METHICILLIN RESISTANT STAPHYLOCOCCUS AUREUS LIGHT GROWTH KLEBSIELLA PNEUMONIAE MODERATE GROWTH CANDIDA LUSITANIAE CRITICAL RESULT CALLED TO, READ BACK BY AND VERIFIED WITH: Kingsley Callander 10/13/14 1203 JGF    Report Status 10/15/2014 FINAL  Final   Organism ID, Bacteria METHICILLIN RESISTANT STAPHYLOCOCCUS AUREUS  Final   Organism ID, Bacteria KLEBSIELLA PNEUMONIAE  Final      Susceptibility   Klebsiella pneumoniae - MIC*    AMPICILLIN 16 RESISTANT Resistant     CEFTAZIDIME <=1 SENSITIVE Sensitive     CEFAZOLIN <=4 SENSITIVE Sensitive     CEFTRIAXONE <=1 SENSITIVE Sensitive     CIPROFLOXACIN <=0.25 SENSITIVE Sensitive     GENTAMICIN <=1 SENSITIVE Sensitive     IMIPENEM <=0.25 SENSITIVE Sensitive     TRIMETH/SULFA <=20 SENSITIVE Sensitive     * LIGHT GROWTH KLEBSIELLA PNEUMONIAE   Methicillin resistant staphylococcus aureus - MIC*    CIPROFLOXACIN >=8 RESISTANT Resistant     ERYTHROMYCIN >=  8 RESISTANT Resistant     GENTAMICIN <=0.5 SENSITIVE Sensitive     OXACILLIN >=4 RESISTANT Resistant     VANCOMYCIN <=0.5 SENSITIVE Sensitive     TRIMETH/SULFA <=10 SENSITIVE Sensitive     CLINDAMYCIN <=0.25 SENSITIVE Sensitive     CEFOXITIN SCREEN Value in next row Resistant      POSITIVECEFOXITIN SCREEN - This test may be used to predict mecA-mediated oxacillin resistance, and it is based on the cefoxitin disk screen test.  The cefoxitin screen and oxacillin work in combination to determine the final interpretation reported for oxacillin.     Inducible  Clindamycin Value in next row Sensitive      POSITIVECEFOXITIN SCREEN - This test may be used to predict mecA-mediated oxacillin resistance, and it is based on the cefoxitin disk screen test.  The cefoxitin screen and oxacillin work in combination to determine the final interpretation reported for oxacillin.     * MODERATE GROWTH METHICILLIN RESISTANT STAPHYLOCOCCUS AUREUS    Medical History: Past Medical History  Diagnosis Date  . Hypertension   . Lewy body dementia   . Parkinson's disease   . CKD (chronic kidney disease)   . TIA (transient ischemic attack)   . Hyperlipidemia   . Hip fracture     Medications:  Prescriptions prior to admission  Medication Sig Dispense Refill Last Dose  . acetaminophen (TYLENOL) 500 MG tablet Take 500 mg by mouth every 4 (four) hours as needed for fever.   PRN  . amLODipine (NORVASC) 2.5 MG tablet Take 2.5 mg by mouth daily.   unknown  . bisacodyl (DULCOLAX) 10 MG suppository Place 10 mg rectally daily as needed for moderate constipation.   PRN  . clopidogrel (PLAVIX) 75 MG tablet Take 75 mg by mouth daily.   unknown  . donepezil (ARICEPT ODT) 10 MG disintegrating tablet Take 10 mg by mouth daily.   unknown  . glycopyrrolate (ROBINUL) 1 MG tablet Take 2 mg by mouth 2 (two) times daily.   unknown  . lisinopril (PRINIVIL,ZESTRIL) 40 MG tablet Take 40 mg by mouth daily.   unknown  . magnesium hydroxide (MILK OF MAGNESIA) 400 MG/5ML suspension Take 30 mLs by mouth every 12 (twelve) hours as needed for mild constipation.   PRN  . metoprolol (LOPRESSOR) 50 MG tablet Take 50 mg by mouth 2 (two) times daily.   unknown  . senna-docusate (SENOKOT-S) 8.6-50 MG per tablet Take 1 tablet by mouth 2 (two) times daily.   unknown  . vitamin C (ASCORBIC ACID) 500 MG tablet Take 500 mg by mouth 2 (two) times daily.   unknown  . donepezil (ARICEPT) 10 MG tablet TAKE 1 TABLET (10 MG TOTAL) BY MOUTH DAILY. (Patient not taking: Reported on 10/11/2014) 90 tablet 0 Not Taking    Assessment: Pharmacy consulted to dose vancomycin in this 81 year old male admitted with sialadenitis, now growing MRSA in sputum cx.  CrCl = 51.5 ml/min Ke = 0.05 hr-1 T1/2 = 13.9 hrs Vd = 42.4 L   Goal of Therapy:  Vancomycin trough level 15-20 mcg/ml  Plan:  Expected duration 7 days with resolution of temperature and/or normalization of WBC   Vancomycin 1 gm IV X 1 given on 9/24 @ 14:00. Vancomycin 1 gm IV Q18H ordered to start on 9/25 @ 00:00 , ~ 10 hrs after 1st dose. This pt will reach [facility] by 9/27 @ 14:00. Will draw 1st trough on 9/27 @ 23:30, which will be at [facility].   Robbins,Jason D 10/15/2014,1:33  PM

## 2014-10-15 NOTE — Progress Notes (Signed)
[facility] - [place] at [facility]   PATIENT NAME: Rick [name]    MR#:  [id number]  DATE OF BIRTH:  07-28-1931  SUBJECTIVE: Unable to provide due to altered mental status  CHIEF COMPLAINT:   Chief Complaint  Patient presents with  . Fever  . Oral Swelling   patient unable to provide any review of systems currently comfortable   Review of Systems  Unable to perform ROS: medical condition    VITAL SIGNS: Blood pressure 148/69, pulse 48, temperature 97.8 F (36.6 C), temperature source Oral, resp. rate 17, height  (1.753 m), weight 60.601 kg (133 lb 9.6 oz), SpO2 98 %.  PHYSICAL EXAMINATION:   GENERAL:  81 y.o.-year-old patient lying in the bed with no acute distress, comfortable on the bed. Able to open eyes versus briefly answers yes and no . Intermittently follows commands EYES: Pupils equal, round, reactive to light and accommodation. No scleral icterus. Extraocular muscles intact.  HEENT: Head atraumatic, normocephalic. Oropharynx and nasopharynx clear.  NECK:  Supple, no jugular venous distention. No thyroid enlargement, no tenderness. Mildly swollen submandibular gland was noted on the left, but no significant tenderness was noted on palpation, swelling has much improved since admission LUNGS: Better air entrance  bilaterally, no wheezing, rales,rhonchi or crepitation. No use of accessory muscles of respiration.  CARDIOVASCULAR: S1, S2 normal. No murmurs, rubs, or gallops.  ABDOMEN: Soft, nontender, nondistended. Bowel sounds present. No organomegaly or mass.  EXTREMITIES: No pedal edema, cyanosis, or clubbing.  NEUROLOGIC: Cranial nerves grossly II through XII are intact. Muscle strength , unable to evaluate in all extremities. Due to poor mental status. Sensation not evaluated. Gait not checked.  PSYCHIATRIC: The patient is somnolent, however, able to open his eyes briefly  SKIN: No obvious rash, lesion, or ulcer.   ORDERS/RESULTS  REVIEWED:   CBC  Recent Labs Lab 10/11/14 1127 10/12/14 0424 10/13/14 0353 10/14/14 0700  WBC 15.3* 12.7* 15.2* 11.3*  HGB 10.8* 10.4* 10.8* 10.5*  HCT 32.7* 30.5* 31.5* 31.1*  PLT 251 230 229 223  MCV 89.6 90.5 89.2 87.7  MCH 29.6 30.7 30.6 29.6  MCHC 33.1 33.9 34.3 33.8  RDW 13.1 13.2 13.2 12.8  LYMPHSABS 0.9*  --   --   --   MONOABS 0.7  --   --   --   EOSABS 0.0  --   --   --   BASOSABS 0.0  --   --   --    ------------------------------------------------------------------------------------------------------------------  Chemistries   Recent Labs Lab 10/11/14 1127 10/12/14 0424  10/13/14 0353 10/13/14 0816 10/13/14 1549 10/14/14 0700 10/15/14 0634  NA 155* 158*  < > 151*  152* 150* 149* 145 139  K 3.4* 3.5  --  3.8  --   --  4.3 4.3  CL 120* 126*  --  120*  --   --  115* 108  CO2 28 27  --  26  --   --  25 26  GLUCOSE 112* 173*  --  174*  --   --  145* 132*  BUN 53* 51*  --  51*  --   --  42* 31*  CREATININE 1.85* 1.60*  --  1.40*  --   --  1.16 0.98  CALCIUM 8.4* 8.2*  --  8.2*  --   --  7.9* 8.0*  AST 18  --   --   --   --   --   --   --  ALT 14*  --   --   --   --   --   --   --   ALKPHOS 93  --   --   --   --   --   --   --   BILITOT 1.6*  --   --   --   --   --   --   --   < > = values in this interval not displayed. ------------------------------------------------------------------------------------------------------------------ estimated creatinine clearance is 51.5 mL/min (by C-G formula based on Cr of 0.98). ------------------------------------------------------------------------------------------------------------------ No results for input(s): TSH, T4TOTAL, T3FREE, THYROIDAB in the last 72 hours.  Invalid input(s): FREET3  Cardiac Enzymes No results for input(s): CKMB, TROPONINI, MYOGLOBIN in the last 168 hours.  Invalid input(s):  CK ------------------------------------------------------------------------------------------------------------------ Invalid input(s): POCBNP ---------------------------------------------------------------------------------------------------------------  RADIOLOGY: No results found.  EKG:  Orders placed or performed during the hospital encounter of 10/11/14  . ED EKG 12-Lead  . ED EKG 12-Lead    ASSESSMENT AND PLAN:  Active Problems:   Sialadenitis   Pressure ulcer   Malnutrition of moderate degree 1. Acute left submandibular gland sialadenitis, patient's sputum growing MRSA place on vancomycin until patient ready for discharge  2. Metabolic encephalopathy due to infection, hypernatremia . Somewhat improved, continue supportive therapy. Patient's oral intake remains very poor and clear baseline, getting palliative care consultation attempted to reach patient's son, and left 2 messages  3. Leukocytosis, improved with antibiotic therapy 4. CKD, stable, improved with therapy, 5. Hypernatremia, resolved on D5 water ,  significant improvement from 158 on admission, unfortunately patient's oral intake remains very poor , start Megace     DRUG ALLERGIES:  Allergies  Allergen Reactions  . Codeine Other (See Comments)    UNK    CODE STATUS:     Code Status Orders        Start     Ordered   10/11/14 1649  Full code   Continuous     10/11/14 1648      TOTAL TIME TAKING CARE OF THIS PATIENT: 35 minute  Auburn Bilberry M.D on 10/15/2014 at 12:17 PM  Between 7am to 6pm - Pager - [phone]  After 6pm go to [email or web address] - password [email or web address] [facility]  [facility]  Office  [phone]  CC: Primary care physician; No primary care provider on file.

## 2014-10-15 NOTE — Progress Notes (Signed)
Dr. Allena Katz notified that pt has not taken anything PO today. Nonverbal.Primary RN requested that meds be switched to IV. Dr. Allena Katz verbalized that he would assess and places orders.  Primary nurse to continue to monitor.

## 2014-10-16 LAB — CULTURE, BLOOD (ROUTINE X 2): Culture: NO GROWTH

## 2014-10-16 LAB — CBC
HCT: 31.4 % — ABNORMAL LOW (ref 40.0–52.0)
Hemoglobin: 10.8 g/dL — ABNORMAL LOW (ref 13.0–18.0)
MCH: 29.8 pg (ref 26.0–34.0)
MCHC: 34.3 g/dL (ref 32.0–36.0)
MCV: 87 fL (ref 80.0–100.0)
PLATELETS: 225 10*3/uL (ref 150–440)
RBC: 3.61 MIL/uL — ABNORMAL LOW (ref 4.40–5.90)
RDW: 12.6 % (ref 11.5–14.5)
WBC: 7.8 10*3/uL (ref 3.8–10.6)

## 2014-10-16 LAB — BASIC METABOLIC PANEL
Anion gap: 5 (ref 5–15)
BUN: 36 mg/dL — AB (ref 6–20)
CO2: 24 mmol/L (ref 22–32)
CREATININE: 1.24 mg/dL (ref 0.61–1.24)
Calcium: 7.9 mg/dL — ABNORMAL LOW (ref 8.9–10.3)
Chloride: 109 mmol/L (ref 101–111)
GFR calc Af Amer: >60 mL/min (ref >60)
GFR, EST NON AFRICAN AMERICAN: 53 mL/min — AB (ref >60)
GLUCOSE: 91 mg/dL (ref 65–99)
POTASSIUM: 4.2 mmol/L (ref 3.5–5.1)
Sodium: 138 mmol/L (ref 135–145)

## 2014-10-16 MED ORDER — MODAFINIL 100 MG PO TABS
100.0000 mg | ORAL_TABLET | Freq: Every day | ORAL | Status: DC
  Administered 2014-10-17 – 2014-10-18 (×2): 100 mg via ORAL
  Filled 2014-10-16 (×2): qty 1

## 2014-10-16 NOTE — Progress Notes (Signed)
[facility] - [place] at [facility]   PATIENT NAME: Rick [name]    MR#:  [id number]  DATE OF BIRTH:  1931-11-03  SUBJECTIVE: Unable to provide due to altered mental status  CHIEF COMPLAINT:   Chief Complaint  Patient presents with  . Fever  . Oral Swelling   patient unable to provide any review of systems currently comfortable  Opens his eyes hard to understand what a St.  Review of Systems  Unable to perform ROS: medical condition    VITAL SIGNS: Blood pressure 127/63, pulse 50, temperature 98.3 F (36.8 C), temperature source Oral, resp. rate 20, height  (1.753 m), weight 60.601 kg (133 lb 9.6 oz), SpO2 96 %.  PHYSICAL EXAMINATION:   GENERAL:  81 y.o.-year-old patient lying in the bed with no acute distress, comfortable on the bed. Able to open eyes versus briefly answers yes and no . Intermittently follows commands EYES: Pupils equal, round, reactive to light and accommodation. No scleral icterus. Extraocular muscles intact.  HEENT: Head atraumatic, normocephalic. Oropharynx and nasopharynx clear.  NECK:  Supple, no jugular venous distention. No thyroid enlargement, no tenderness. Mildly swollen submandibular gland was noted on the left, but no significant tenderness was noted on palpation, swelling has much improved since admission LUNGS: Better air entrance  bilaterally, no wheezing, rales,rhonchi or crepitation. No use of accessory muscles of respiration.  CARDIOVASCULAR: S1, S2 normal. No murmurs, rubs, or gallops.  ABDOMEN: Soft, nontender, nondistended. Bowel sounds present. No organomegaly or mass.  EXTREMITIES: No pedal edema, cyanosis, or clubbing.  NEUROLOGIC: Cranial nerves grossly II through XII are intact. Muscle strength , unable to evaluate in all extremities. Due to poor mental status. Sensation not evaluated. Gait not checked.  PSYCHIATRIC: The patient is somnolent, however, able to open his eyes briefly  SKIN: No obvious rash,  lesion, or ulcer.   ORDERS/RESULTS REVIEWED:   CBC  Recent Labs Lab 10/11/14 1127 10/12/14 0424 10/13/14 0353 10/14/14 0700 10/16/14 0937  WBC 15.3* 12.7* 15.2* 11.3* 7.8  HGB 10.8* 10.4* 10.8* 10.5* 10.8*  HCT 32.7* 30.5* 31.5* 31.1* 31.4*  PLT 251 230 229 223 225  MCV 89.6 90.5 89.2 87.7 87.0  MCH 29.6 30.7 30.6 29.6 29.8  MCHC 33.1 33.9 34.3 33.8 34.3  RDW 13.1 13.2 13.2 12.8 12.6  LYMPHSABS 0.9*  --   --   --   --   MONOABS 0.7  --   --   --   --   EOSABS 0.0  --   --   --   --   BASOSABS 0.0  --   --   --   --    ------------------------------------------------------------------------------------------------------------------  Chemistries   Recent Labs Lab 10/11/14 1127 10/12/14 0424  10/13/14 0353 10/13/14 0816 10/13/14 1549 10/14/14 0700 10/15/14 0634 10/16/14 0937  NA 155* 158*  < > 151*  152* 150* 149* 145 139 138  K 3.4* 3.5  --  3.8  --   --  4.3 4.3 4.2  CL 120* 126*  --  120*  --   --  115* 108 109  CO2 28 27  --  26  --   --  GLUCOSE 112* 173*  --  174*  --   --  145* 132* 91  BUN 53* 51*  --  51*  --   --  42* 31* 36*  CREATININE 1.85* 1.60*  --  1.40*  --   --  1.16 0.98  1.24  CALCIUM 8.4* 8.2*  --  8.2*  --   --  7.9* 8.0* 7.9*  AST 18  --   --   --   --   --   --   --   --   ALT 14*  --   --   --   --   --   --   --   --   ALKPHOS 93  --   --   --   --   --   --   --   --   BILITOT 1.6*  --   --   --   --   --   --   --   --   < > = values in this interval not displayed. ------------------------------------------------------------------------------------------------------------------ estimated creatinine clearance is 40.7 mL/min (by C-G formula based on Cr of 1.24). ------------------------------------------------------------------------------------------------------------------ No results for input(s): TSH, T4TOTAL, T3FREE, THYROIDAB in the last 72 hours.  Invalid input(s): FREET3  Cardiac Enzymes No results for input(s):  CKMB, TROPONINI, MYOGLOBIN in the last 168 hours.  Invalid input(s): CK ------------------------------------------------------------------------------------------------------------------ Invalid input(s): POCBNP ---------------------------------------------------------------------------------------------------------------  RADIOLOGY: No results found.  EKG:  Orders placed or performed during the hospital encounter of 10/11/14  . ED EKG 12-Lead  . ED EKG 12-Lead    ASSESSMENT AND PLAN:  Active Problems:   Sialadenitis   Pressure ulcer   Malnutrition of moderate degree 1. Acute left submandibular gland sialadenitis, patient's sputum growing MRSA continue IV Vancocin  2. Metabolic encephalopathy due to infection, hypernatremia . Somewhat improved, continue supportive therapy. Patient's oral intake remains very poor Megace added to his current regimen as well as I will start him on Provigil to help with his mental status during the day 3. Leukocytosis, improved with antibiotic therapy 4. Acute on CKD, stable, improved with therapy, 5. Hypernatremia, resolved on D5 water ,  significant improvement from 158 on admission   DRUG ALLERGIES:  Allergies  Allergen Reactions  . Codeine Other (See Comments)    UNK    CODE STATUS:     Code Status Orders        Start     Ordered   10/11/14 1649  Full code   Continuous     10/11/14 1648      TOTAL TIME TAKING CARE OF THIS PATIENT: 32 minute  Auburn Bilberry M.D on 10/16/2014 at 11:21 AM  Between 7am to 6pm - Pager - [phone]  After 6pm go to [email or web address] - password [email or web address] [facility]  [facility]  Office  [phone]  CC: Primary care physician; No primary care provider on file.

## 2014-10-17 LAB — BASIC METABOLIC PANEL
ANION GAP: 3 — AB (ref 5–15)
BUN: 40 mg/dL — ABNORMAL HIGH (ref 6–20)
CHLORIDE: 110 mmol/L (ref 101–111)
CO2: 26 mmol/L (ref 22–32)
Calcium: 8.1 mg/dL — ABNORMAL LOW (ref 8.9–10.3)
Creatinine, Ser: 1.26 mg/dL — ABNORMAL HIGH (ref 0.61–1.24)
GFR calc non Af Amer: 52 mL/min — ABNORMAL LOW (ref >60)
GLUCOSE: 88 mg/dL (ref 65–99)
POTASSIUM: 4.2 mmol/L (ref 3.5–5.1)
Sodium: 139 mmol/L (ref 135–145)

## 2014-10-17 LAB — VANCOMYCIN, RANDOM: Vancomycin Rm: 18 ug/mL

## 2014-10-17 MED ORDER — PREDNISONE 50 MG PO TABS
50.0000 mg | ORAL_TABLET | Freq: Every day | ORAL | Status: DC
  Administered 2014-10-18: 50 mg via ORAL
  Filled 2014-10-17: qty 1

## 2014-10-17 MED ORDER — VANCOMYCIN HCL IN DEXTROSE 1-5 GM/200ML-% IV SOLN
1000.0000 mg | INTRAVENOUS | Status: DC
  Administered 2014-10-17 – 2014-10-18 (×2): 1000 mg via INTRAVENOUS
  Filled 2014-10-17 (×3): qty 200

## 2014-10-17 MED ORDER — ALBUTEROL SULFATE (2.5 MG/3ML) 0.083% IN NEBU: 2.5000 mg | INHALATION_SOLUTION | RESPIRATORY_TRACT | Status: AC | PRN

## 2014-10-17 MED ORDER — MODAFINIL 100 MG PO TABS: 100.0000 mg | ORAL_TABLET | Freq: Every day | ORAL | Status: AC

## 2014-10-17 MED ORDER — ACETAMINOPHEN 325 MG PO TABS: 650.0000 mg | ORAL_TABLET | Freq: Four times a day (QID) | ORAL | Status: AC | PRN

## 2014-10-17 NOTE — Progress Notes (Signed)
Called son left message

## 2014-10-17 NOTE — Progress Notes (Signed)
ANTIBIOTIC CONSULT NOTE - follow up  Pharmacy Consult for Vancomycin Indication: Sialadenitis  Allergies  Allergen Reactions  . Codeine Other (See Comments)    UNK    Patient Measurements: Height:  (175.3 cm) Weight: 133 lb 9.6 oz (60.601 kg) IBW/kg (Calculated) : 70.7 Adjusted Body Weight: 60.6 kg   Vital Signs: Temp: 97.6 F (36.4 C) (09/26 0542) Temp Source: Oral (09/26 0542) BP: 134/69 mmHg (09/26 0542) Pulse Rate: 63 (09/26 0542) Intake/Output from previous day: 09/25 0701 - 09/26 0700 In: -  Out: 1050 [Urine:1050] Intake/Output from this shift: Total I/O In: -  Out: 100 [Urine:100]  Labs:  Recent Labs  10/15/14 0634 10/16/14 0937 10/17/14 1004  WBC  --  7.8  --   HGB  --  10.8*  --   PLT  --  225  --   CREATININE 0.98 1.24 1.26*   Estimated Creatinine Clearance: 40.1 mL/min (by C-G formula based on Cr of 1.26). No results for input(s): VANCOTROUGH, VANCOPEAK, VANCORANDOM, GENTTROUGH, GENTPEAK, GENTRANDOM, TOBRATROUGH, TOBRAPEAK, TOBRARND, AMIKACINPEAK, AMIKACINTROU, AMIKACIN in the last 72 hours.   Microbiology: Recent Results (from the past 720 hour(s))  Blood Culture (routine x 2)     Status: None   Collection Time: 10/11/14 11:27 AM  Result Value Ref Range Status   Specimen Description BLOOD LEFT ARM  Final   Special Requests BOTTLES DRAWN AEROBIC AND ANAEROBIC 2CC  Final   Culture  Setup Time   Final    GRAM POSITIVE COCCI IN CLUSTERS AEROBIC BOTTLE ONLY CRITICAL RESULT CALLED TO, READ BACK BY AND VERIFIED WITH: MICHAEL JACOBS AT 1543 10/12/14 CTJ    Culture   Final    COAGULASE NEGATIVE STAPHYLOCOCCUS AEROBIC BOTTLE ONLY Results consistent with contamination.    Report Status 10/16/2014 FINAL  Final  Blood Culture (routine x 2)     Status: None   Collection Time: 10/11/14 11:27 AM  Result Value Ref Range Status   Specimen Description BLOOD RIGHT ARM  Final   Special Requests BOTTLES DRAWN AEROBIC AND ANAEROBIC 4CC  Final   Culture  NO GROWTH 5 DAYS  Final   Report Status 10/16/2014 FINAL  Final  Wound culture     Status: None   Collection Time: 10/11/14  5:36 PM  Result Value Ref Range Status   Specimen Description WOUND  Final   Special Requests NONE  Final   Gram Stain   Final    MODERATE WBC SEEN RARE GRAM NEGATIVE RODS RARE GRAM POSITIVE COCCI IN CLUSTERS    Culture   Final    MODERATE GROWTH METHICILLIN RESISTANT STAPHYLOCOCCUS AUREUS LIGHT GROWTH KLEBSIELLA PNEUMONIAE MODERATE GROWTH CANDIDA LUSITANIAE CRITICAL RESULT CALLED TO, READ BACK BY AND VERIFIED WITH: Kingsley Callander 10/13/14 1203 JGF    Report Status 10/15/2014 FINAL  Final   Organism ID, Bacteria METHICILLIN RESISTANT STAPHYLOCOCCUS AUREUS  Final   Organism ID, Bacteria KLEBSIELLA PNEUMONIAE  Final      Susceptibility   Klebsiella pneumoniae - MIC*    AMPICILLIN 16 RESISTANT Resistant     CEFTAZIDIME <=1 SENSITIVE Sensitive     CEFAZOLIN <=4 SENSITIVE Sensitive     CEFTRIAXONE <=1 SENSITIVE Sensitive     CIPROFLOXACIN <=0.25 SENSITIVE Sensitive     GENTAMICIN <=1 SENSITIVE Sensitive     IMIPENEM <=0.25 SENSITIVE Sensitive     TRIMETH/SULFA <=20 SENSITIVE Sensitive     * LIGHT GROWTH KLEBSIELLA PNEUMONIAE   Methicillin resistant staphylococcus aureus - MIC*    CIPROFLOXACIN >=8 RESISTANT Resistant  ERYTHROMYCIN >=8 RESISTANT Resistant     GENTAMICIN <=0.5 SENSITIVE Sensitive     OXACILLIN >=4 RESISTANT Resistant     VANCOMYCIN <=0.5 SENSITIVE Sensitive     TRIMETH/SULFA <=10 SENSITIVE Sensitive     CLINDAMYCIN <=0.25 SENSITIVE Sensitive     CEFOXITIN SCREEN Value in next row Resistant      POSITIVECEFOXITIN SCREEN - This test may be used to predict mecA-mediated oxacillin resistance, and it is based on the cefoxitin disk screen test.  The cefoxitin screen and oxacillin work in combination to determine the final interpretation reported for oxacillin.     Inducible Clindamycin Value in next row Sensitive      POSITIVECEFOXITIN SCREEN -  This test may be used to predict mecA-mediated oxacillin resistance, and it is based on the cefoxitin disk screen test.  The cefoxitin screen and oxacillin work in combination to determine the final interpretation reported for oxacillin.     * MODERATE GROWTH METHICILLIN RESISTANT STAPHYLOCOCCUS AUREUS  Culture, blood (routine x 2)     Status: None (Preliminary result)   Collection Time: 10/13/14  3:49 PM  Result Value Ref Range Status   Specimen Description BLOOD RIGHT HAND  Final   Special Requests BAA/AEB  Final   Culture NO GROWTH 4 DAYS  Final   Report Status PENDING  Incomplete  Culture, blood (routine x 2)     Status: None (Preliminary result)   Collection Time: 10/13/14  3:49 PM  Result Value Ref Range Status   Specimen Description BLOOD LEFT HAND  Final   Special Requests BAA/AEB  Final   Culture NO GROWTH 4 DAYS  Final   Report Status PENDING  Incomplete    Medical History: Past Medical History  Diagnosis Date  . Hypertension   . Lewy body dementia   . Parkinson's disease   . CKD (chronic kidney disease)   . TIA (transient ischemic attack)   . Hyperlipidemia   . Hip fracture     Medications:  Prescriptions prior to admission  Medication Sig Dispense Refill Last Dose  . acetaminophen (TYLENOL) 500 MG tablet Take 500 mg by mouth every 4 (four) hours as needed for fever.   PRN  . amLODipine (NORVASC) 2.5 MG tablet Take 2.5 mg by mouth daily.   unknown  . bisacodyl (DULCOLAX) 10 MG suppository Place 10 mg rectally daily as needed for moderate constipation.   PRN  . clopidogrel (PLAVIX) 75 MG tablet Take 75 mg by mouth daily.   unknown  . donepezil (ARICEPT ODT) 10 MG disintegrating tablet Take 10 mg by mouth daily.   unknown  . glycopyrrolate (ROBINUL) 1 MG tablet Take 2 mg by mouth 2 (two) times daily.   unknown  . lisinopril (PRINIVIL,ZESTRIL) 40 MG tablet Take 40 mg by mouth daily.   unknown  . magnesium hydroxide (MILK OF MAGNESIA) 400 MG/5ML suspension Take 30 mLs  by mouth every 12 (twelve) hours as needed for mild constipation.   PRN  . metoprolol (LOPRESSOR) 50 MG tablet Take 50 mg by mouth 2 (two) times daily.   unknown  . senna-docusate (SENOKOT-S) 8.6-50 MG per tablet Take 1 tablet by mouth 2 (two) times daily.   unknown  . vitamin C (ASCORBIC ACID) 500 MG tablet Take 500 mg by mouth 2 (two) times daily.   unknown  . donepezil (ARICEPT) 10 MG tablet TAKE 1 TABLET (10 MG TOTAL) BY MOUTH DAILY. (Patient not taking: Reported on 10/11/2014) 90 tablet 0 Not Taking   Assessment: 82  yo male admitted with sialadenitis. Pharmacy consulted for dosing and monitorig of vancomycin.  Would culture positive for MRSA on 10/11/14. Slight decrease in CrCl to 40.7 and Scr trending up at 1.24 today.   9/24: Vancomycin 1g q18 hours, with Css  on 9/27. Trough ordered for 9/27   Ke = 0.05 hr-1 T1/2 = 13.9 hrs Vd = 42.4 L   Goal of Therapy:  Vancomycin trough level 15-20 mcg/ml  Plan:  Will continue vancomycin at current dose of 1g every 18 hours based on random from 1000 today that came back at 18.     Scr ordered for 0500 on 9/27. Pharmacy will continue to monitor renal function and make adjustments as needed.   Cher Nakai, PharmD Pharmacy Resident

## 2014-10-17 NOTE — Care Management Important Message (Signed)
Important Message  Patient Details  Name: Rick GOYA Sr. MRN: [id number] Date of Birth: 1931-03-29   Medicare Important Message Given:  Yes-third notification given    Adonis Huguenin, RN 10/17/2014, 8:23 AM

## 2014-10-17 NOTE — Progress Notes (Signed)
[facility] - [place] at [facility]   PATIENT NAME: Rick [name]    MR#:  [id number]  DATE OF BIRTH:  [birth year]  SUBJECTIVE: Patient more awake   CHIEF COMPLAINT:   Chief Complaint  Patient presents with  . Fever  . Oral Swelling   patient unable to provide any review of systems currently comfortable  Opens his eyes hard to understand what a St.  Review of Systems  Unable to perform ROS: medical condition    VITAL SIGNS: Blood pressure 134/69, pulse 63, temperature 97.6 F (36.4 C), temperature source Oral, resp. rate 20, height  (1.753 m), weight 60.601 kg (133 lb 9.6 oz), SpO2 100 %.  PHYSICAL EXAMINATION:   GENERAL:  81 y.o.-year-old patient lying in the bed with no acute distress, comfortable on the bed. Able to open eyes versus briefly answers yes and no . Intermittently follows commands EYES: Pupils equal, round, reactive to light and accommodation. No scleral icterus. Extraocular muscles intact.  HEENT: Head atraumatic, normocephalic. Oropharynx and nasopharynx clear.  NECK:  Supple, no jugular venous distention. No thyroid enlargement, no tenderness. Mildly swollen submandibular gland was noted on the left, but no significant tenderness was noted on palpation, swelling has much improved since admission LUNGS: Better air entrance  bilaterally, no wheezing, rales,rhonchi or crepitation. No use of accessory muscles of respiration.  CARDIOVASCULAR: S1, S2 normal. No murmurs, rubs, or gallops.  ABDOMEN: Soft, nontender, nondistended. Bowel sounds present. No organomegaly or mass.  EXTREMITIES: No pedal edema, cyanosis, or clubbing.  NEUROLOGIC: Cranial nerves grossly II through XII are intact. Muscle strength , unable to evaluate in all extremities.   PSYCHIATRIC: More awake and alert SKIN: No obvious rash, lesion, or ulcer.   ORDERS/RESULTS REVIEWED:   CBC  Recent Labs Lab 10/11/14 1127 10/12/14 0424 10/13/14 0353 10/14/14 0700  10/16/14 0937  WBC 15.3* 12.7* 15.2* 11.3* 7.8  HGB 10.8* 10.4* 10.8* 10.5* 10.8*  HCT 32.7* 30.5* 31.5* 31.1* 31.4*  PLT 251 230 229 223 225  MCV 89.6 90.5 89.2 87.7 87.0  MCH 29.6 30.7 30.6 29.6 29.8  MCHC 33.1 33.9 34.3 33.8 34.3  RDW 13.1 13.2 13.2 12.8 12.6  LYMPHSABS 0.9*  --   --   --   --   MONOABS 0.7  --   --   --   --   EOSABS 0.0  --   --   --   --   BASOSABS 0.0  --   --   --   --    ------------------------------------------------------------------------------------------------------------------  Chemistries   Recent Labs Lab 10/11/14 1127  10/13/14 0353  10/13/14 1549 10/14/14 0700 10/15/14 0634 10/16/14 0937 10/17/14 1004  NA 155*  < > 151*  152*  < > 149* 145 139 138 139  K 3.4*  < > 3.8  --   --  4.3 4.3 4.2 4.2  CL 120*  < > 120*  --   --  115* 108 109 110  CO2 28  < > 26  --   --  GLUCOSE 112*  < > 174*  --   --  145* 132* 91 88  BUN 53*  < > 51*  --   --  42* 31* 36* 40*  CREATININE 1.85*  < > 1.40*  --   --  1.16 0.98 1.24 1.26*  CALCIUM 8.4*  < > 8.2*  --   --  7.9* 8.0* 7.9* 8.1*  AST 18  --   --   --   --   --   --   --   --  ALT 14*  --   --   --   --   --   --   --   --   ALKPHOS 93  --   --   --   --   --   --   --   --   BILITOT 1.6*  --   --   --   --   --   --   --   --   < > = values in this interval not displayed. ------------------------------------------------------------------------------------------------------------------ estimated creatinine clearance is 40.1 mL/min (by C-G formula based on Cr of 1.26). ------------------------------------------------------------------------------------------------------------------ No results for input(s): TSH, T4TOTAL, T3FREE, THYROIDAB in the last 72 hours.  Invalid input(s): FREET3  Cardiac Enzymes No results for input(s): CKMB, TROPONINI, MYOGLOBIN in the last 168 hours.  Invalid input(s):  CK ------------------------------------------------------------------------------------------------------------------ Invalid input(s): POCBNP ---------------------------------------------------------------------------------------------------------------  RADIOLOGY: No results found.  EKG:  Orders placed or performed during the hospital encounter of 10/11/14  . ED EKG 12-Lead  . ED EKG 12-Lead    ASSESSMENT AND PLAN:  Active Problems:   Sialadenitis   Pressure ulcer   Malnutrition of moderate degree 1. Acute left submandibular gland sialadenitis, patient's sputum growing MRSA continue IV Vancocin 1 more day  2. Metabolic encephalopathy due to infection, hypernatremia . Improved continue Provigil 3. Leukocytosis, improved with antibiotic therapy 4. Acute on CKD, stable, improved with therapy, 5. Hypernatremia, resolved on D5 water ,  due to free water deficit 6. Poor by mouth intake case discussed with son who states that his father ate good yesterday, I brought up the case about a PEG tube he states that he does not want his father to undergo anesthesia.  DRUG ALLERGIES:  Allergies  Allergen Reactions  . Codeine Other (See Comments)    UNK   Disposition discharge tomorrow to a skilled nursing facility CODE STATUS:     Code Status Orders        Start     Ordered   10/11/14 1649  Full code   Continuous     10/11/14 1648      TOTAL TIME TAKING CARE OF THIS PATIENT: 25 minute  Auburn Bilberry M.D on 10/17/2014 at 11:17 AM  Between 7am to 6pm - Pager - [phone]  After 6pm go to [email or web address] - password [email or web address] [facility]  [facility]  Office  [phone]  CC: Primary care physician; No primary care provider on file.

## 2014-10-17 NOTE — Care Management (Signed)
Discharge plan if for SNF [facility] upon discharge. CSW following.

## 2014-10-17 NOTE — Progress Notes (Signed)
Nutrition Follow-up  DOCUMENTATION CODES:   Non-severe (moderate) malnutrition in context of chronic illness  INTERVENTION:   Meals and Snacks: Cater to patient preferences. Continue encouragement at meal times. Medical Food Supplement Therapy: will increase Magic Cup to TID as CNA reports pt eats very well.   NUTRITION DIAGNOSIS:   Inadequate oral intake related to mouth pain, acute illness as evidenced by meal completion < 25%.  GOAL:   Patient will meet greater than or equal to 90% of their needs; ongoing  MONITOR:    (Energy intake, Electrolyte and renal profile, )  REASON FOR ASSESSMENT:   Malnutrition Screening Tool    ASSESSMENT:    Per MD note, MD addressed possibility of PEG to son and son does not want father to undergo anesthesia.   Diet Order:  DIET - DYS 1 Room service appropriate?: Yes; Fluid consistency:: Nectar Thick    Current Nutrition: RD spoke with CNA Kerrie who fed pt all weekend long and this morning. CNA reports pt ate very well this am, 100% of eggs, bread, yogurt, orange juice and mighty shake. CNA reports pt ate 0% of breakfast yesterday, 50% of lunch and 100% of dinner last night. CNA also reports that pt ate very little on Saturday. Per CNA pt eats very well when he is awake enough to eat but that pt was very sleepy/lethargic at the beginning of the weekend.   Gastrointestinal Profile: Last BM: 10/15/2014   Medications: diflucan, megace, prednisone  Electrolyte/Renal Profile and Glucose Profile:   Recent Labs Lab 10/15/14 0634 10/16/14 0937 10/17/14 1004  NA 139 138 139  K 4.3 4.2 4.2  CL 108 109 110  CO2 BUN 31* 36* 40*  CREATININE 0.98 1.24 1.26*  CALCIUM 8.0* 7.9* 8.1*  GLUCOSE 132* 91 88   Protein Profile:  Recent Labs Lab 10/11/14 1127  ALBUMIN 2.9*     Weight Trend since Admission: Filed Weights   10/11/14 1104 10/11/14 1620  Weight: 149 lb (67.586 kg) 133 lb 9.6 oz (60.601 kg)     Skin: pressure  ulcer stage II sacrum   Height:   Ht Readings from Last 1 Encounters:  10/11/14  (1.753 m)    Weight:   Wt Readings from Last 1 Encounters:  10/11/14 133 lb 9.6 oz (60.601 kg)    BMI:  Body mass index is 19.72 kg/(m^2).  Estimated Nutritional Needs:   Kcal:  Using IBW of 73kg BEE 1430 kcals (IF 1.1-1.3, AF 1.2) 6045-4098 kcals/d.   Protein:  Using IBW of 73kg (1.1-1.3 g/kg) 80-95 g/d  Fluid:  Using IBW of 73kg (30-81ml/kg) 2190-2557ml/d  EDUCATION NEEDS:   No education needs identified at this time   MODERATE Care Level  Leda Quail, RD, LDN Pager [phone]

## 2014-10-17 NOTE — Progress Notes (Signed)
CSW spoke with Tresa Endo in admissions with [facility] who reports she received call from pt's son who would like pt placed at their facility and they are able to offer. SW will continue to follow for d/c to [facility] at d/c.   Dellie Burns, MSW, [place] [phone]

## 2014-10-17 NOTE — Progress Notes (Signed)
Physical Therapy Treatment Patient Details Name: Rick RACKLEY Sr. MRN: [id number] DOB: 11/15/1931 Today's Date: 10/17/2014    History of Present Illness 81 yo male with onset of inflammation of L submandibular gland and discovered transverse aortic arch aneurysm was admitted, clear chest CT scan.    PT Comments    Upon entering room, pt's LE's again in "wind-swept" position to R in bed.  Pt talking to therapist some during session but was very difficult to understand.  Pt initially assisting some with LE ex's in bed (R>L LE) but then stopped participating limiting session.  Pt moving R LE on his own in bed but not L LE.  Discussed pt with SW who reported she called to verify pt's PLOF last week but never heard back.  Pt has facility set-up for discharge.   Follow Up Recommendations  Other (comment) (SNF vs LTC pending prior level of function)     Equipment Recommendations       Recommendations for Other Services       Precautions / Restrictions Precautions Precautions: Fall Restrictions Weight Bearing Restrictions: No    Mobility  Bed Mobility                  Transfers                    Ambulation/Gait                 Stairs            Wheelchair Mobility    Modified Rankin (Stroke Patients Only)       Balance                                    Cognition Arousal/Alertness: Awake/alert Behavior During Therapy: Flat affect Overall Cognitive Status: Difficult to assess                      Exercises   Performed semi-supine B LE therapeutic exercise x 10 reps: heelslides (AAROM R; AAROM L), hip abd/adduction (AAROM R; AAROM L).  Performed 30 seconds x2 L hip stretching into ER and R hip stretching into IR.  Pt required vc's and tactile cues for correct technique with exercises.     General Comments   Nursing cleared pt for participation in physical therapy.  Pt agreeable to PT session.       Pertinent Vitals/Pain Pain Assessment: Faces Pain Score: 2  Pain Location: with stretching/ROM of LE's (unable to determine exact location) Pain Intervention(s): Limited activity within patient's tolerance;Monitored during session  Vitals stable and WFL throughout treatment session.    Home Living                      Prior Function            PT Goals (current goals can now be found in the care plan section) Acute Rehab PT Goals Patient Stated Goal: pt did not state goals when asked PT Goal Formulation: Patient unable to participate in goal setting Progress towards PT goals: PT to reassess next treatment    Frequency  Min 2X/week    PT Plan Other (comment) (Will continue with current plan until PLOF is obtained)    Co-evaluation             End of Session   Activity Tolerance: Patient limited by fatigue;Patient  limited by lethargy Patient left: in bed;with call bell/phone within reach;with bed alarm set     Time: 1150-1158 PT Time Calculation (min) (ACUTE ONLY): 8 min  Charges:  $Therapeutic Exercise: 8-22 mins                    G CodesHendricks Limes 11/10/14, 12:54 PM Hendricks Limes, PT [phone]

## 2014-10-17 NOTE — Progress Notes (Signed)
Palliative Medicine Inpatient Consult Follow Up Note   Name: Rick DORIAN Sr. Date: 10/17/2014 MRN: [id number]  DOB: [birth year]  Referring Physician: Auburn Bilberry, MD  Palliative Care consult requested for this 81 y.o. male for goals of medical therapy in patient with  with inflammation of salivary glands. He has a dementia also and goals of care are desired since this could be a reversible problem OR it could be part of a larger picture of overall decline with dysphagia and decreased oral intake associated with advancing dementia.   IMPRESSION Primary Problems: Sialadenitis left submandibular salivary gland ---Use of Robinul 2 mg po bid at nursing facility for secretions is noted in his med list ---inadequate oral intake less than 25% ---ENT consult obtained from Dr. Jenne Campus who obtained pus for cx MRSA and KLEBSIELLA in wound from 9/20 ---currently on oral Septra NOT EATING WELL  --but, has eaten better last couple of days (when fed small bites of pureed)  ADDITIONAL PROBLEMS: Essential HTN Lewy Body Dementia Parkinson's Dementia CKD H/O TIA Dyslipidemia H/o hip fracture Mobility and Gait Disorder due to dementia and Parkinsons Coag neg Staph in a blood cx --felt to be contamination Dyphagia --with pureed nectar thick diet ordered (must be fed) Anemia of unclear etiology Hyperglycemia associated with recent steroid Rx Weakness/ lethargy ---------------------------------------------------------------------------------------------  DISCUSSION/ PLAN: 1.  Pt is still full code--but I plan to talk with pt's son when he arrives soon.  If I miss him, I will call him prior to pts discharge tomorrow.  2.  I educated granddaughter a bit about dementia patient's loss of appetite, ability to swallow and walk and also about ways to talk with and reassure dementia patients, etc.   3.  Apparently, son would not want his father to have anesthesia so for this reason he doesn't  want his dad to have a feeding tube. We need to talk about other reasons to not want a feeding tube, if I can meet with son today or else early tomorrow.    REVIEW OF SYSTEMS:  Patient is not able to provide ROS due to dementia  CODE STATUS: Full code --I have not yet talked with son but he is on his way so will attempt to talk with him then.    PAST MEDICAL HISTORY: Past Medical History  Diagnosis Date  . Hypertension   . Lewy body dementia   . Parkinson's disease   . CKD (chronic kidney disease)   . TIA (transient ischemic attack)   . Hyperlipidemia   . Hip fracture     PAST SURGICAL HISTORY:  Past Surgical History  Procedure Laterality Date  . None      Vital Signs: BP 116/55 mmHg  Pulse 70  Temp(Src) 98 F (36.7 C) (Oral)  Resp 17  Ht  (1.753 m)  Wt 60.601 kg (133 lb 9.6 oz)  BMI 19.72 kg/m2  SpO2 98% Filed Weights   10/11/14 1104 10/11/14 1620  Weight: 67.586 kg (149 lb) 60.601 kg (133 lb 9.6 oz)    Estimated body mass index is 19.72 kg/(m^2) as calculated from the following:   Height as of this encounter:  (1.753 m).   Weight as of this encounter: 60.601 kg (133 lb 9.6 oz).  PHYSICAL EXAM: Weak appearing Lethargic with eyes closed but opens them slightly off and on while I am in room talking with his granddaughter Temporal wasting is noted Neck w/o JVD or TM --swelling is reduced Heart rrr no mgr Lungs cta  Abd soft and nt Ext no c/c/e but has atrophied muscles   LABS: CBC:    Component Value Date/Time   WBC 7.8 10/16/2014 0937   WBC 9.1 10/07/2013 0813   HGB 10.8* 10/16/2014 0937   HGB 8.5* 10/07/2013 0813   HCT 31.4* 10/16/2014 0937   HCT 25.5* 10/07/2013 0813   PLT 225 10/16/2014 0937   PLT 270 10/07/2013 0813   MCV 87.0 10/16/2014 0937   MCV 91 10/07/2013 0813   NEUTROABS 13.7* 10/11/2014 1127   NEUTROABS 6.8* 10/07/2013 0813   LYMPHSABS 0.9* 10/11/2014 1127   LYMPHSABS 1.1 10/07/2013 0813   MONOABS 0.7 10/11/2014 1127    MONOABS 0.7 10/07/2013 0813   EOSABS 0.0 10/11/2014 1127   EOSABS 0.5 10/07/2013 0813   BASOSABS 0.0 10/11/2014 1127   BASOSABS 0.1 10/07/2013 0813   Comprehensive Metabolic Panel:    Component Value Date/Time   NA 139 10/17/2014 1004   NA 140 10/07/2013 0813   K 4.2 10/17/2014 1004   K 3.6 10/07/2013 0813   CL 110 10/17/2014 1004   CL 108* 10/07/2013 0813   CO2 26 10/17/2014 1004   CO2 28 10/07/2013 0813   BUN 40* 10/17/2014 1004   BUN 27* 10/07/2013 0813   CREATININE 1.26* 10/17/2014 1004   CREATININE 1.32* 10/07/2013 0813   GLUCOSE 88 10/17/2014 1004   GLUCOSE 112* 10/07/2013 0813   CALCIUM 8.1* 10/17/2014 1004   CALCIUM 7.7* 10/07/2013 0813   AST 18 10/11/2014 1127   AST 31 09/30/2013 1020   ALT 14* 10/11/2014 1127   ALT 21 09/30/2013 1020   ALKPHOS 93 10/11/2014 1127   ALKPHOS 79 09/30/2013 1020   BILITOT 1.6* 10/11/2014 1127   BILITOT 1.4* 09/30/2013 1020   PROT 7.0 10/11/2014 1127   PROT 7.1 09/30/2013 1020   ALBUMIN 2.9* 10/11/2014 1127   ALBUMIN 3.2* 09/30/2013 1020     More than 50% of the visit was spent in counseling/coordination of care: YES  Time Spent: 35 min

## 2014-10-17 NOTE — Progress Notes (Signed)
Speech Language Pathology Treatment: Dysphagia  Patient Details Name: Rick OSBORN Sr. MRN: [id number] DOB: 04/14/1931 Today's Date: 10/17/2014 Time: 7829-5621 SLP Time Calculation (min) (ACUTE ONLY): 60 min  Assessment / Plan / Recommendation Clinical Impression  Pt presents with oral pharyngeal phase dysphagia c/b minor oral spillage, slightly prolonged oral phase, intermittently poor labial seal and delayed cough and throat clear. Pt's respiratory status remained stable. Pt took 2-3 ounces of a magic cup and about 2 ounces of nectar thick apple juice by teaspoon. Delayed cough observed intermittently; suspect possible pharyngeal residue.  Alternated ice cream magic cup with juice, gave extra time to allow for dry swallows and continued with small bites to manage possible residue. Continue with aspiration precautions and current diet. NSG updated.    HPI Other Pertinent Information: Rick [name] is a 81 y.o. male with a known history of Lewy body dementia, Parkinson's disease, CK D and hypertension, who has been a resident of [facility] skilled nursing facility was brought in secondary to lethargy and fevers. Patient, due to his dementia, does not interact much according to family. His daughter-in-law is at bedside and provides most of the history. Patient is lethargic on exam. His mouth is open and very dry. According to daughter-in-law, his dementia has progressively gotten worse in the last few months. His speech is not understandable at baseline. He does not have dysphagia according to her. Pt has been seen by ENT d/t increased left neck swelling. Pt has tolerated his Dys. 1 diet w/ Nectar liquids per NSG since BSE. Pt remains sleepy and confused; requires feeding and encouragement to take PO's.    Pertinent Vitals Pain Assessment: No/denies pain Pain Score: 2  Pain Location: with stretching/ROM of LE's (unable to determine exact location) Pain Intervention(s): Limited activity  within patient's tolerance;Monitored during session  SLP Plan  Continue with current plan of care    Recommendations Diet recommendations: Dysphagia 1 (puree);Nectar-thick liquid Liquids provided via: Teaspoon Medication Administration: Crushed with puree (as able) Supervision: Full supervision/cueing for compensatory strategies;Trained caregiver to feed patient Compensations: Minimize environmental distractions;Slow rate;Small sips/bites;Check for pocketing;Follow solids with liquid Postural Changes and/or Swallow Maneuvers: Seated upright 90 degrees              Oral Care Recommendations: Oral care QID;Oral care before and after PO;Staff/trained caregiver to provide oral care Follow up Recommendations: Skilled Nursing facility Plan: Continue with current plan of care    GO     Meghan Esper 10/17/2014, 3:15 PM

## 2014-10-18 LAB — CULTURE, BLOOD (ROUTINE X 2)
CULTURE: NO GROWTH
CULTURE: NO GROWTH

## 2014-10-18 LAB — CREATININE, SERUM: CREATININE: 1.12 mg/dL (ref 0.61–1.24)

## 2014-10-18 MED ORDER — LINEZOLID 600 MG PO TABS: 600.0000 mg | ORAL_TABLET | Freq: Two times a day (BID) | ORAL | Status: AC

## 2014-10-18 MED ORDER — PREDNISONE 10 MG PO TABS: 10.0000 mg | ORAL_TABLET | Freq: Every day | ORAL | Status: AC

## 2014-10-18 MED ORDER — PREDNISONE 10 MG PO TABS: 10.0000 mg | ORAL_TABLET | Freq: Every day | ORAL | Status: DC

## 2014-10-18 MED ORDER — MEGESTROL ACETATE 40 MG/ML PO SUSP: 400.0000 mg | Freq: Every day | ORAL | Status: AC

## 2014-10-18 NOTE — Discharge Summary (Signed)
Rick Blue Sr., 81 y.o., DOB [birth year], MRN [id number]. Admission date: 10/11/2014 Discharge Date 10/18/2014 Primary MD No primary care provider on file. Admitting Physician Enid Baas, MD  Admission Diagnosis  Submandibular gland infection [K11.20] Altered mental status, unspecified altered mental status type [R41.82]  Discharge Diagnosis   Active Problems:   Sialadenitis   Pressure ulcer   Malnutrition of moderate degree  Lewy body dementia which is likely progressive Chronic kidney disease TIA Hyperlipidemia Hip fracture Hypertension Hyper natremia        Hospital Course Rick [name] is a 81 y.o. male with a known history of Lewy , Parkinson's disease, CK D and hypertension, who has been a resident of [facility] skilled nursing facility was brought in secondary to lethargy and fevers. Patient, due to his dementia, does not interact much according to family. Patient had a CT of the neck shows possible enlargement and inflammation of left submandibular salivary gland. So patient is being admitted for sepsis secondary to sialadenitis. Patient was admitted to the hospital also was seen by ENT was started on anabiotic's culture did show MRSA from the expression from the mouth. Patient's swelling has resolved. He he was also noticed to be severely dehydrated with hypernatremia. Patient was treated with fluids. And anabiotic's. His mental status is waxing and waning. He was started on Megace to improve his appetite. Discussions were held with the son. He is still a full code. It was discussed with the son regarding PEG tube. He did not want him to undergo anesthesia so no PEG tube. It appears that his Lewy body dementia is getting worst his prognosis is very poor.             Consults ENT, palliative care  Significant Tests:  See full reports for all details    Ct Soft Tissue Neck W Contrast  10/11/2014   CLINICAL DATA:  Anterior neck swelling for 3 days.   EXAM: CT NECK WITH CONTRAST  TECHNIQUE: Multidetector CT imaging of the neck was performed using the standard protocol following the bolus administration of intravenous contrast.  CONTRAST:  60mL OMNIPAQUE IOHEXOL 300 MG/ML  SOLN  COMPARISON:  None.  FINDINGS: Pharynx and larynx: No definite abnormality seen. Epiglottis and larynx appear normal.  Salivary glands: Parotid and right submandibular glands appear normal. Left submandibular gland is enlarged with inflammatory stranding noted in the adjacent soft tissues.  Thyroid: Normal.  Lymph nodes: Mildly enlarged lymph nodes are noted in the left sub mandibular region with the largest measuring 8 mm.  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  Limited intracranial: No abnormality seen.  Visualized orbits: Visualized portions appear normal.  Mastoids and visualized paranasal sinuses: Visualized portions appear normal.  Skeleton: Severe degenerative disc disease is noted at C5-6 and C6-7.  Upper chest: No significant abnormality is noted in the visualized lung fields.  IMPRESSION: Left submandibular gland appears enlarged with inflammatory stranding in the adjacent soft tissues as well as mildly enlarged lymph nodes, consistent with inflammation of unknown etiology.  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   Electronically Signed   By: Lupita Raider, M.D.   On: 10/11/2014 13:23   Dg Chest Port 1 View  10/11/2014   CLINICAL DATA:  Sepsis.  Fever.  EXAM: PORTABLE CHEST - 1 VIEW  COMPARISON:  10/07/2013  FINDINGS: The heart size and mediastinal contours are within normal limits. Both lungs are clear. The visualized skeletal structures are unremarkable.  IMPRESSION: No active disease.   Electronically Signed   By: Charlett Nose M.D.   On: 10/11/2014 11:16       Today   Subjective:   Rick [name] remains same awake but not interactive  Objective:   Blood pressure 113/60, pulse 76, temperature 98.9 F  (37.2 C), temperature source Oral, resp. rate 18, height  (1.753 m), weight 60.601 kg (133 lb 9.6 oz), SpO2 97 %.  .  Intake/Output Summary (Last 24 hours) at 10/18/14 0928 Last data filed at 10/18/14 0845  Gross per 24 hour  Intake    345 ml  Output   1025 ml  Net   -680 ml    Exam VITAL SIGNS: Blood pressure 113/60, pulse 76, temperature 98.9 F (37.2 C), temperature source Oral, resp. rate 18, height  (1.753 m), weight 60.601 kg (133 lb 9.6 oz), SpO2 97 %.  GENERAL:  81 y.o.-year-old patient lying in the bed with no acute distress.  EYES: Pupils equal, round, reactive to light and accommodation. No scleral icterus. Extraocular muscles intact.  HEENT: Head atraumatic, normocephalic. Oropharynx and nasopharynx clear.  NECK:  Supple, no jugular venous distention. No thyroid enlargement, no tenderness.  LUNGS: Normal breath sounds bilaterally, no wheezing, rales,rhonchi or crepitation. No use of accessory muscles of respiration.  CARDIOVASCULAR: S1, S2 normal. No murmurs, rubs, or gallops.  ABDOMEN: Soft, nontender, nondistended. Bowel sounds present. No organomegaly or mass.  EXTREMITIES: No pedal edema, cyanosis, or clubbing.  NEUROLOGIC Limited exam due to patient's ability to follow commands PSYCHIATRIC: Patient awake but not oriented to place person or time SKIN: No obvious rash, lesion, or ulcer.   Data Review     CBC w Diff:  Lab Results  Component Value Date   WBC 7.8 10/16/2014   WBC 9.1 10/07/2013   HGB 10.8* 10/16/2014   HGB 8.5* 10/07/2013   HCT 31.4* 10/16/2014   HCT 25.5* 10/07/2013   PLT 225 10/16/2014   PLT 270 10/07/2013   LYMPHOPCT 6 10/11/2014   LYMPHOPCT 11.7 10/07/2013   MONOPCT 5 10/11/2014   MONOPCT 7.4 10/07/2013   EOSPCT 0 10/11/2014   EOSPCT 5.4 10/07/2013   BASOPCT 0 10/11/2014   BASOPCT 1.0 10/07/2013   CMP:  Lab Results  Component Value Date   NA 139 10/17/2014   NA 140 10/07/2013   K 4.2 10/17/2014   K 3.6 10/07/2013    CL 110 10/17/2014   CL 108* 10/07/2013   CO2 26 10/17/2014   CO2 28 10/07/2013   BUN 40* 10/17/2014   BUN 27* 10/07/2013   CREATININE 1.12 10/18/2014   CREATININE 1.32* 10/07/2013   PROT 7.0 10/11/2014   PROT 7.1 09/30/2013   ALBUMIN 2.9* 10/11/2014   ALBUMIN 3.2* 09/30/2013   BILITOT 1.6* 10/11/2014   BILITOT 1.4* 09/30/2013   ALKPHOS 93 10/11/2014   ALKPHOS 79 09/30/2013   AST 18 10/11/2014   AST 31 09/30/2013   ALT 14* 10/11/2014   ALT 21 09/30/2013  .  Micro Results Recent Results (from the past 240 hour(s))  Blood Culture (routine x 2)     Status: None   Collection Time: 10/11/14 11:27 AM  Result Value Ref Range Status   Specimen Description BLOOD LEFT ARM  Final   Special Requests BOTTLES DRAWN AEROBIC AND ANAEROBIC 2CC  Final   Culture  Setup Time   Final    GRAM POSITIVE COCCI IN CLUSTERS AEROBIC BOTTLE ONLY CRITICAL RESULT CALLED TO, READ BACK BY AND VERIFIED WITH:  MICHAEL JACOBS AT 1543 10/12/14 CTJ    Culture   Final    COAGULASE NEGATIVE STAPHYLOCOCCUS AEROBIC BOTTLE ONLY Results consistent with contamination.    Report Status 10/16/2014 FINAL  Final  Blood Culture (routine x 2)     Status: None   Collection Time: 10/11/14 11:27 AM  Result Value Ref Range Status   Specimen Description BLOOD RIGHT ARM  Final   Special Requests BOTTLES DRAWN AEROBIC AND ANAEROBIC 4CC  Final   Culture NO GROWTH 5 DAYS  Final   Report Status 10/16/2014 FINAL  Final  Wound culture     Status: None   Collection Time: 10/11/14  5:36 PM  Result Value Ref Range Status   Specimen Description WOUND  Final   Special Requests NONE  Final   Gram Stain   Final    MODERATE WBC SEEN RARE GRAM NEGATIVE RODS RARE GRAM POSITIVE COCCI IN CLUSTERS    Culture   Final    MODERATE GROWTH METHICILLIN RESISTANT STAPHYLOCOCCUS AUREUS LIGHT GROWTH KLEBSIELLA PNEUMONIAE MODERATE GROWTH CANDIDA LUSITANIAE CRITICAL RESULT CALLED TO, READ BACK BY AND VERIFIED WITH: Kingsley Callander 10/13/14 1203  JGF    Report Status 10/15/2014 FINAL  Final   Organism ID, Bacteria METHICILLIN RESISTANT STAPHYLOCOCCUS AUREUS  Final   Organism ID, Bacteria KLEBSIELLA PNEUMONIAE  Final      Susceptibility   Klebsiella pneumoniae - MIC*    AMPICILLIN 16 RESISTANT Resistant     CEFTAZIDIME <=1 SENSITIVE Sensitive     CEFAZOLIN <=4 SENSITIVE Sensitive     CEFTRIAXONE <=1 SENSITIVE Sensitive     CIPROFLOXACIN <=0.25 SENSITIVE Sensitive     GENTAMICIN <=1 SENSITIVE Sensitive     IMIPENEM <=0.25 SENSITIVE Sensitive     TRIMETH/SULFA <=20 SENSITIVE Sensitive     * LIGHT GROWTH KLEBSIELLA PNEUMONIAE   Methicillin resistant staphylococcus aureus - MIC*    CIPROFLOXACIN >=8 RESISTANT Resistant     ERYTHROMYCIN >=8 RESISTANT Resistant     GENTAMICIN <=0.5 SENSITIVE Sensitive     OXACILLIN >=4 RESISTANT Resistant     VANCOMYCIN <=0.5 SENSITIVE Sensitive     TRIMETH/SULFA <=10 SENSITIVE Sensitive     CLINDAMYCIN <=0.25 SENSITIVE Sensitive     CEFOXITIN SCREEN Value in next row Resistant      POSITIVECEFOXITIN SCREEN - This test may be used to predict mecA-mediated oxacillin resistance, and it is based on the cefoxitin disk screen test.  The cefoxitin screen and oxacillin work in combination to determine the final interpretation reported for oxacillin.     Inducible Clindamycin Value in next row Sensitive      POSITIVECEFOXITIN SCREEN - This test may be used to predict mecA-mediated oxacillin resistance, and it is based on the cefoxitin disk screen test.  The cefoxitin screen and oxacillin work in combination to determine the final interpretation reported for oxacillin.     * MODERATE GROWTH METHICILLIN RESISTANT STAPHYLOCOCCUS AUREUS  Culture, blood (routine x 2)     Status: None (Preliminary result)   Collection Time: 10/13/14  3:49 PM  Result Value Ref Range Status   Specimen Description BLOOD RIGHT HAND  Final   Special Requests BAA/AEB  Final   Culture NO GROWTH 4 DAYS  Final   Report Status PENDING   Incomplete  Culture, blood (routine x 2)     Status: None (Preliminary result)   Collection Time: 10/13/14  3:49 PM  Result Value Ref Range Status   Specimen Description BLOOD LEFT HAND  Final   Special Requests BAA/AEB  Final   Culture NO GROWTH 4 DAYS  Final   Report Status PENDING  Incomplete        Code Status Orders        Start     Ordered   10/11/14 1649  Full code   Continuous     10/11/14 1648              Follow-up Information    Follow up with md at snf In 7 days.      Discharge Medications     Medication List    STOP taking these medications        glycopyrrolate 1 MG tablet  Commonly known as:  ROBINUL     lisinopril 40 MG tablet  Commonly known as:  PRINIVIL,ZESTRIL     vitamin C 500 MG tablet  Commonly known as:  ASCORBIC ACID      TAKE these medications        acetaminophen 325 MG tablet  Commonly known as:  TYLENOL  Take 2 tablets (650 mg total) by mouth every 6 (six) hours as needed for mild pain (or Fever >/= 101).     albuterol (2.5 MG/3ML) 0.083% nebulizer solution  Commonly known as:  PROVENTIL  Take 3 mLs (2.5 mg total) by nebulization every 2 (two) hours as needed for wheezing.     amLODipine 2.5 MG tablet  Commonly known as:  NORVASC  Take 2.5 mg by mouth daily.     bisacodyl 10 MG suppository  Commonly known as:  DULCOLAX  Place 10 mg rectally daily as needed for moderate constipation.     clopidogrel 75 MG tablet  Commonly known as:  PLAVIX  Take 75 mg by mouth daily.     donepezil 10 MG disintegrating tablet  Commonly known as:  ARICEPT ODT  Take 10 mg by mouth daily.     linezolid 600 MG tablet  Commonly known as:  ZYVOX  Take 1 tablet (600 mg total) by mouth 2 (two) times daily.     magnesium hydroxide 400 MG/5ML suspension  Commonly known as:  MILK OF MAGNESIA  Take 30 mLs by mouth every 12 (twelve) hours as needed for mild constipation.     megestrol 40 MG/ML suspension  Commonly known as:  MEGACE   Take 10 mLs (400 mg total) by mouth daily.     metoprolol 50 MG tablet  Commonly known as:  LOPRESSOR  Take 50 mg by mouth 2 (two) times daily.     modafinil 100 MG tablet  Commonly known as:  PROVIGIL  Take 1 tablet (100 mg total) by mouth daily.     predniSONE 10 MG tablet  Commonly known as:  DELTASONE  Take 1 tablet (10 mg total) by mouth daily with breakfast.  Start taking on:  10/19/2014     senna-docusate 8.6-50 MG per tablet  Commonly known as:  Senokot-S  Take 1 tablet by mouth 2 (two) times daily.           Total Time in preparing paper work, data evaluation and todays exam - 35 minutes  Auburn Bilberry M.D on 10/18/2014 at 9:28 AM  [facility]   Office  [phone]

## 2014-10-18 NOTE — Discharge Instructions (Signed)
°  DIET:  Dysphagia 1, necktor thick liquids  DISCHARGE CONDITION:  Fair  ACTIVITY:  Activity as tolerated, PT evaluation and treatment  OXYGEN:  Home Oxygen: No.   Oxygen Delivery: room air  DISCHARGE LOCATION:  nursing home    ADDITIONAL DISCHARGE INSTRUCTION:   If you experience worsening of your admission symptoms, develop shortness of breath, life threatening emergency, suicidal or homicidal thoughts you must seek medical attention immediately by calling 911 or calling your MD immediately  if symptoms less severe.  You Must read complete instructions/literature along with all the possible adverse reactions/side effects for all the Medicines you take and that have been prescribed to you. Take any new Medicines after you have completely understood and accpet all the possible adverse reactions/side effects.   Please note  You were cared for by a hospitalist during your hospital stay. If you have any questions about your discharge medications or the care you received while you were in the hospital after you are discharged, you can call the unit and asked to speak with the hospitalist on call if the hospitalist that took care of you is not available. Once you are discharged, your primary care physician will handle any further medical issues. Please note that NO REFILLS for any discharge medications will be authorized once you are discharged, as it is imperative that you return to your primary care physician (or establish a relationship with a primary care physician if you do not have one) for your aftercare needs so that they can reassess your need for medications and monitor your lab values.

## 2014-10-18 NOTE — Progress Notes (Signed)
Pt received discharge orders per SCM. Report was called to facility and Given to [place]. IV removed with dressing dry and intact. Pt was sent to facility with Foley Catheter. Primary RN never received callback from MD. Facility aware. Pt was escorted out via EMS

## 2014-10-18 NOTE — Clinical Social Work Placement (Signed)
   CLINICAL SOCIAL WORK PLACEMENT  NOTE  Date:  10/18/2014  Patient Details  Name: Rick TESSLER Sr. MRN: [id number] Date of Birth: [birth year]  Clinical Social Work is seeking post-discharge placement for this patient at the Skilled  Nursing Facility level of care (*CSW will initial, date and re-position this form in  chart as items are completed):  Yes   Patient/family provided with [facility] Clinical Social Work Department's list of facilities offering this level of care within the geographic area requested by the patient (or if unable, by the patient's family).  Yes   Patient/family informed of their freedom to choose among providers that offer the needed level of care, that participate in Medicare, Medicaid or managed care program needed by the patient, have an available bed and are willing to accept the patient.  Yes   Patient/family informed of [facility]'s ownership interest in [facility] and [facility], as well as of the fact that they are under no obligation to receive care at these facilities.  PASRR submitted to EDS on       PASRR number received on       Existing PASRR number confirmed on 10/18/14     FL2 transmitted to all facilities in geographic area requested by pt/family on 10/17/14     FL2 transmitted to all facilities within larger geographic area on       Patient informed that his/her managed care company has contracts with or will negotiate with certain facilities, including the following:        Yes   Patient/family informed of bed offers received.  Patient chooses bed at  [facility])     Physician recommends and patient chooses bed at      Patient to be transferred to  [facility] ) on 10/18/14.  Patient to be transferred to facility by EMS     Patient family notified on 10/18/14 of transfer.  Name of family member notified:  Son/ Britt Bottom.      PHYSICIAN       Additional Comment:     _______________________________________________ Ned Card, LCSW 10/18/2014, 12:14 PM

## 2014-10-18 NOTE — Progress Notes (Signed)
Dr. Retta Mac paged in regard to pt being discharged with or without Foley Cath   Awaiting callback

## 2014-10-18 NOTE — Progress Notes (Signed)
CSW was notified that Pt has been medically cleared for DC to [facility].  Pt will be long-term care at the facility. Pt would benefit from hospice/palliative services at SNF if family agrees. CSW discussed with facility representative to follow up with family at SNF. DC packet was prepared and notified of dc. RN to call report and EMS for dc.    CSW updated Pt's son Britt Bottom of plan. Family will be coming to hospital to see Pt. They arranged for Pt to get haircut prior to dc in his room. Pt pleased and doing well this morning.   No further CSW needs at this time.   Wilford Grist, LCSW [phone]

## 2014-10-19 DIAGNOSIS — E46 Unspecified protein-calorie malnutrition: Secondary | ICD-10-CM | POA: Diagnosis not present

## 2014-10-19 DIAGNOSIS — I10 Essential (primary) hypertension: Secondary | ICD-10-CM | POA: Diagnosis not present

## 2014-10-19 DIAGNOSIS — G3183 Dementia with Lewy bodies: Secondary | ICD-10-CM | POA: Diagnosis not present

## 2014-10-19 DIAGNOSIS — G20 Parkinson's disease: Secondary | ICD-10-CM | POA: Diagnosis not present

## 2014-10-20 DIAGNOSIS — I10 Essential (primary) hypertension: Secondary | ICD-10-CM | POA: Diagnosis not present

## 2014-11-01 DIAGNOSIS — D692 Other nonthrombocytopenic purpura: Secondary | ICD-10-CM | POA: Diagnosis not present

## 2014-11-01 DIAGNOSIS — F0281 Dementia in other diseases classified elsewhere with behavioral disturbance: Secondary | ICD-10-CM | POA: Diagnosis not present

## 2014-11-01 DIAGNOSIS — I10 Essential (primary) hypertension: Secondary | ICD-10-CM | POA: Diagnosis not present

## 2014-11-01 DIAGNOSIS — D649 Anemia, unspecified: Secondary | ICD-10-CM | POA: Diagnosis not present

## 2014-11-01 DIAGNOSIS — G3183 Dementia with Lewy bodies: Secondary | ICD-10-CM | POA: Diagnosis not present

## 2014-11-03 DIAGNOSIS — N390 Urinary tract infection, site not specified: Secondary | ICD-10-CM | POA: Diagnosis not present

## 2014-11-24 DIAGNOSIS — I129 Hypertensive chronic kidney disease with stage 1 through stage 4 chronic kidney disease, or unspecified chronic kidney disease: Secondary | ICD-10-CM | POA: Diagnosis not present

## 2014-11-24 DIAGNOSIS — I714 Abdominal aortic aneurysm, without rupture: Secondary | ICD-10-CM | POA: Diagnosis not present

## 2014-11-24 DIAGNOSIS — N183 Chronic kidney disease, stage 3 (moderate): Secondary | ICD-10-CM | POA: Diagnosis not present

## 2014-12-30 DIAGNOSIS — I129 Hypertensive chronic kidney disease with stage 1 through stage 4 chronic kidney disease, or unspecified chronic kidney disease: Secondary | ICD-10-CM | POA: Diagnosis not present

## 2014-12-30 DIAGNOSIS — I714 Abdominal aortic aneurysm, without rupture: Secondary | ICD-10-CM | POA: Diagnosis not present

## 2014-12-30 DIAGNOSIS — L89309 Pressure ulcer of unspecified buttock, unspecified stage: Secondary | ICD-10-CM | POA: Diagnosis not present

## 2014-12-30 DIAGNOSIS — G3183 Dementia with Lewy bodies: Secondary | ICD-10-CM | POA: Diagnosis not present

## 2014-12-30 DIAGNOSIS — N183 Chronic kidney disease, stage 3 (moderate): Secondary | ICD-10-CM | POA: Diagnosis not present

## 2015-03-07 DIAGNOSIS — E870 Hyperosmolality and hypernatremia: Secondary | ICD-10-CM | POA: Diagnosis not present

## 2015-04-01 IMAGING — CR DG CHEST 1V PORT
1 series · 1 of 1 positions shown · non-contrast
Comparison: 09/30/2013

CLINICAL DATA: Fevers

EXAM:
PORTABLE CHEST - 1 VIEW

[ap]
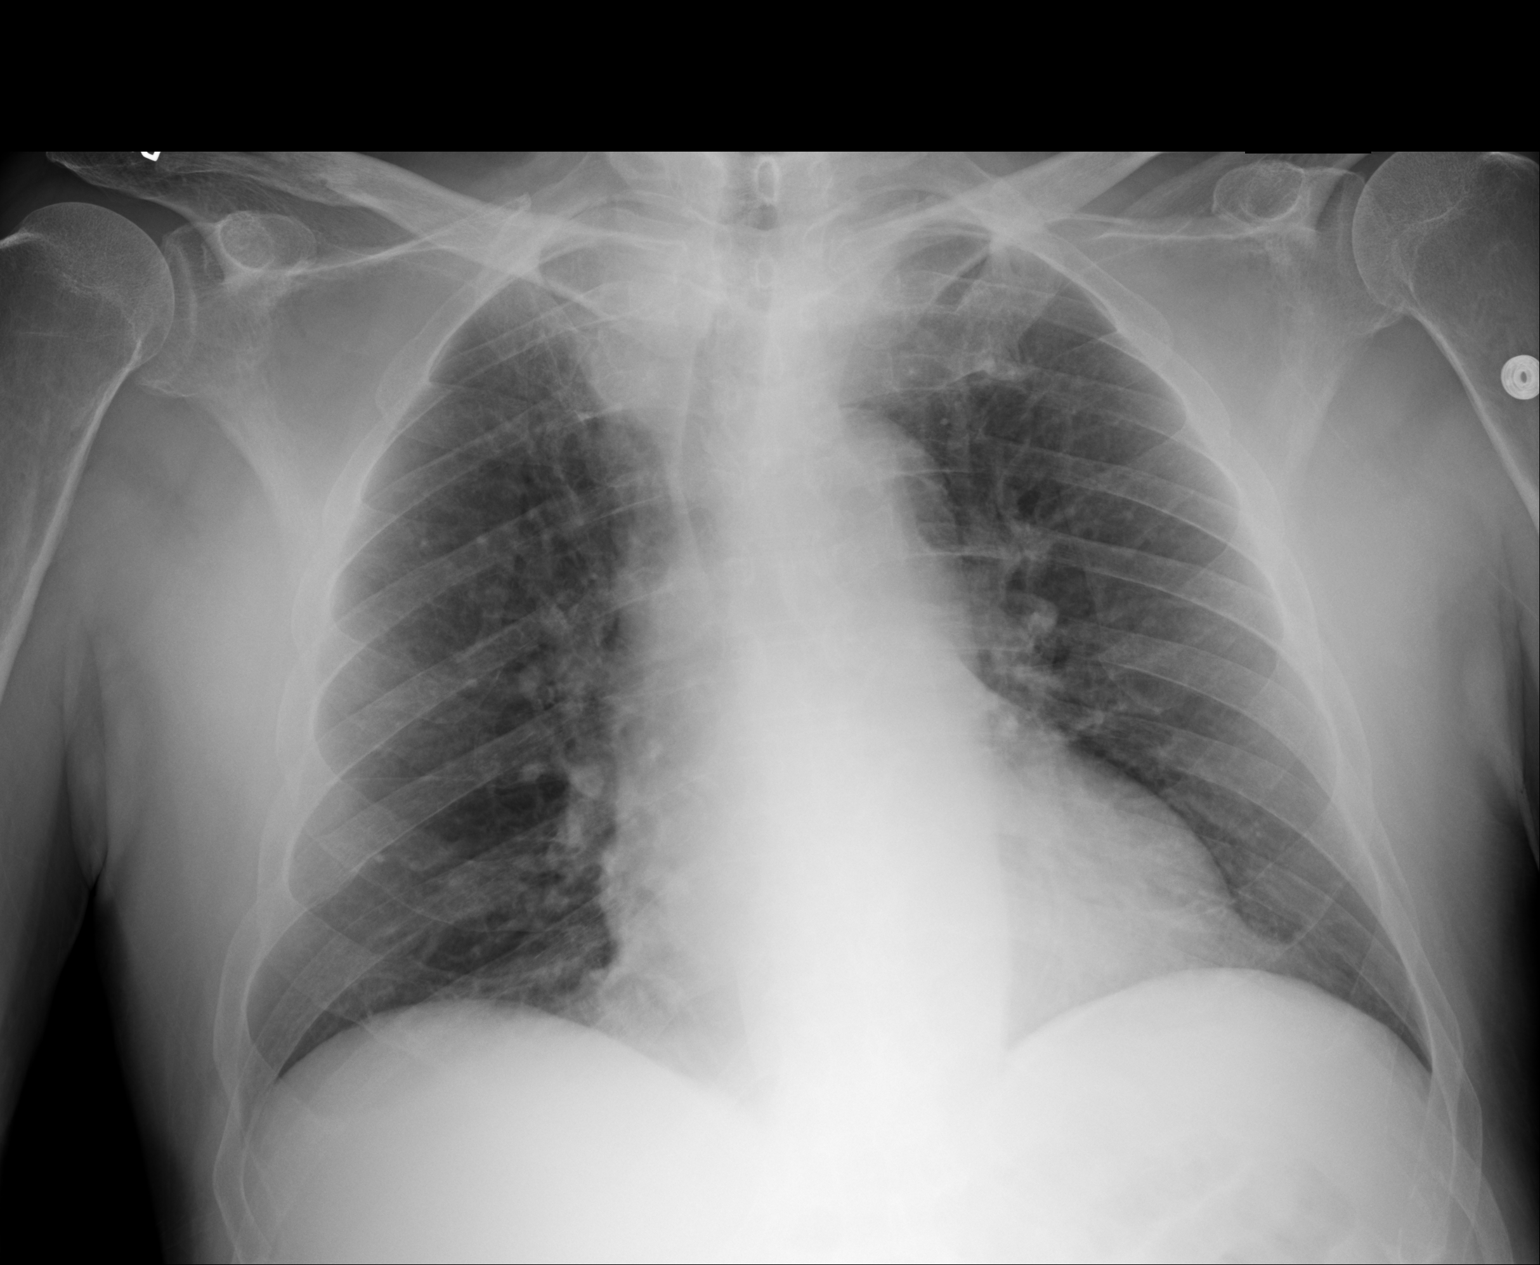

[1 of 1 positions shown; findings below may reference images not displayed]

FINDINGS: Cardiac shadow is within normal limits. The lungs are clear
bilaterally. Some fullness of the right peritracheal region is noted
related to prominent paraspinal fat as seen on recent CT
examination.
IMPRESSION: No acute abnormality is noted.

## 2015-04-28 DIAGNOSIS — E46 Unspecified protein-calorie malnutrition: Secondary | ICD-10-CM | POA: Diagnosis not present

## 2015-04-28 DIAGNOSIS — I10 Essential (primary) hypertension: Secondary | ICD-10-CM | POA: Diagnosis not present

## 2015-04-28 DIAGNOSIS — L89159 Pressure ulcer of sacral region, unspecified stage: Secondary | ICD-10-CM | POA: Diagnosis not present

## 2015-04-28 DIAGNOSIS — G3183 Dementia with Lewy bodies: Secondary | ICD-10-CM | POA: Diagnosis not present

## 2015-06-26 DIAGNOSIS — L89159 Pressure ulcer of sacral region, unspecified stage: Secondary | ICD-10-CM | POA: Diagnosis not present

## 2015-06-26 DIAGNOSIS — G3183 Dementia with Lewy bodies: Secondary | ICD-10-CM | POA: Diagnosis not present

## 2015-06-26 DIAGNOSIS — G20 Parkinson's disease: Secondary | ICD-10-CM | POA: Diagnosis not present

## 2015-08-22 DIAGNOSIS — H5203 Hypermetropia, bilateral: Secondary | ICD-10-CM | POA: Diagnosis not present

## 2015-08-22 DIAGNOSIS — H5210 Myopia, unspecified eye: Secondary | ICD-10-CM | POA: Diagnosis not present

## 2015-08-22 DIAGNOSIS — H5200 Hypermetropia, unspecified eye: Secondary | ICD-10-CM | POA: Diagnosis not present

## 2015-08-28 DIAGNOSIS — I10 Essential (primary) hypertension: Secondary | ICD-10-CM | POA: Diagnosis not present

## 2015-08-28 DIAGNOSIS — G3183 Dementia with Lewy bodies: Secondary | ICD-10-CM | POA: Diagnosis not present

## 2015-08-28 DIAGNOSIS — G20 Parkinson's disease: Secondary | ICD-10-CM | POA: Diagnosis not present

## 2015-10-06 DIAGNOSIS — I10 Essential (primary) hypertension: Secondary | ICD-10-CM | POA: Diagnosis not present

## 2015-10-06 DIAGNOSIS — G3183 Dementia with Lewy bodies: Secondary | ICD-10-CM | POA: Diagnosis not present

## 2015-10-06 DIAGNOSIS — H6123 Impacted cerumen, bilateral: Secondary | ICD-10-CM | POA: Diagnosis not present

## 2015-11-29 DIAGNOSIS — G3183 Dementia with Lewy bodies: Secondary | ICD-10-CM | POA: Diagnosis not present

## 2015-11-29 DIAGNOSIS — I10 Essential (primary) hypertension: Secondary | ICD-10-CM | POA: Diagnosis not present

## 2015-12-11 DIAGNOSIS — L89323 Pressure ulcer of left buttock, stage 3: Secondary | ICD-10-CM | POA: Diagnosis not present

## 2015-12-21 DIAGNOSIS — G3183 Dementia with Lewy bodies: Secondary | ICD-10-CM | POA: Diagnosis not present

## 2015-12-22 DIAGNOSIS — I10 Essential (primary) hypertension: Secondary | ICD-10-CM | POA: Diagnosis not present

## 2015-12-22 DIAGNOSIS — N189 Chronic kidney disease, unspecified: Secondary | ICD-10-CM | POA: Diagnosis not present

## 2015-12-22 DIAGNOSIS — G20 Parkinson's disease: Secondary | ICD-10-CM | POA: Diagnosis not present

## 2015-12-22 DIAGNOSIS — R634 Abnormal weight loss: Secondary | ICD-10-CM | POA: Diagnosis not present

## 2015-12-22 DIAGNOSIS — G3183 Dementia with Lewy bodies: Secondary | ICD-10-CM | POA: Diagnosis not present

## 2015-12-22 DIAGNOSIS — E1310 Other specified diabetes mellitus with ketoacidosis without coma: Secondary | ICD-10-CM | POA: Diagnosis not present

## 2015-12-22 DIAGNOSIS — I679 Cerebrovascular disease, unspecified: Secondary | ICD-10-CM | POA: Diagnosis not present

## 2015-12-22 DIAGNOSIS — R630 Anorexia: Secondary | ICD-10-CM | POA: Diagnosis not present

## 2015-12-22 DIAGNOSIS — E785 Hyperlipidemia, unspecified: Secondary | ICD-10-CM | POA: Diagnosis not present

## 2016-01-31 DIAGNOSIS — G3183 Dementia with Lewy bodies: Secondary | ICD-10-CM | POA: Diagnosis not present

## 2016-01-31 DIAGNOSIS — G20 Parkinson's disease: Secondary | ICD-10-CM | POA: Diagnosis not present

## 2016-02-14 DIAGNOSIS — R05 Cough: Secondary | ICD-10-CM | POA: Diagnosis not present

## 2016-02-22 DEATH — deceased

## 2016-04-10 IMAGING — CR DG CHEST 1V PORT
1 series · 2 of 2 positions shown · non-contrast
Comparison: 10/07/2013

CLINICAL DATA: Sepsis.  Fever.

EXAM:
PORTABLE CHEST - 1 VIEW

[Series 1: ap · 0.17mm/px · 2 of 2 slices shown]
[im 1/2]
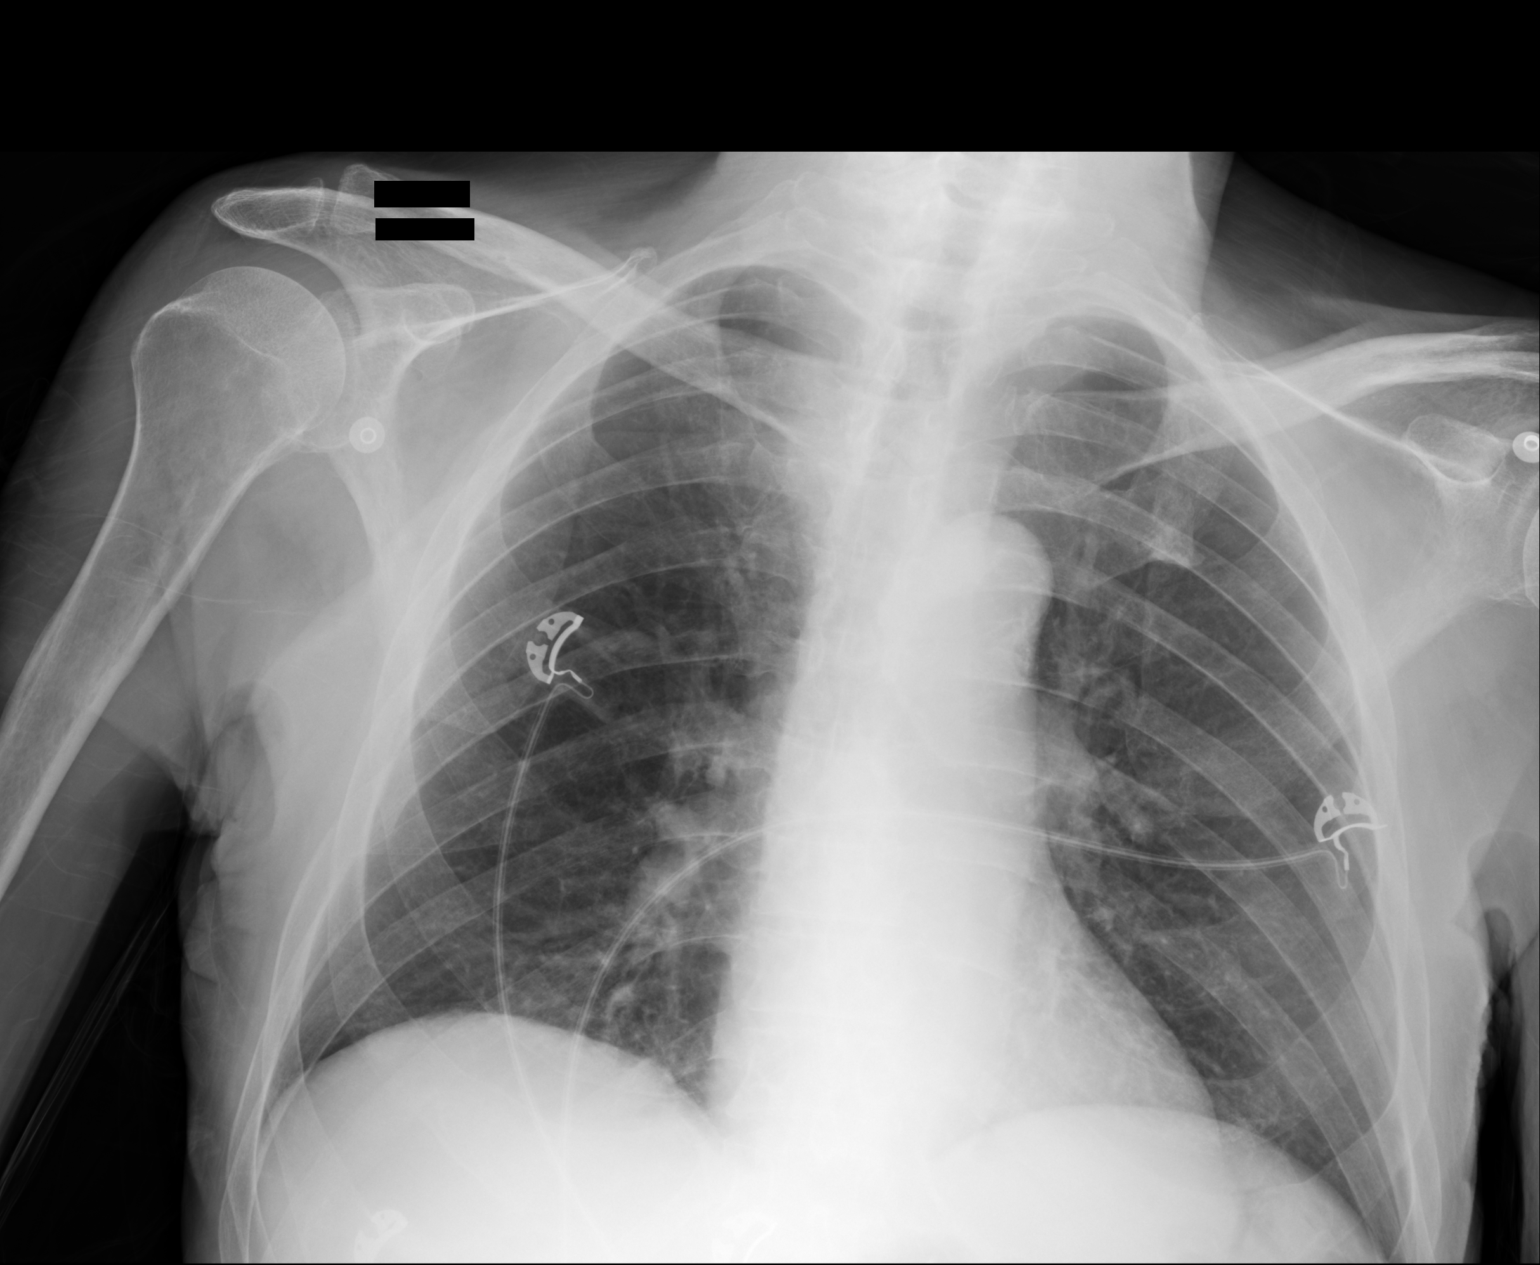
[im 2/2]
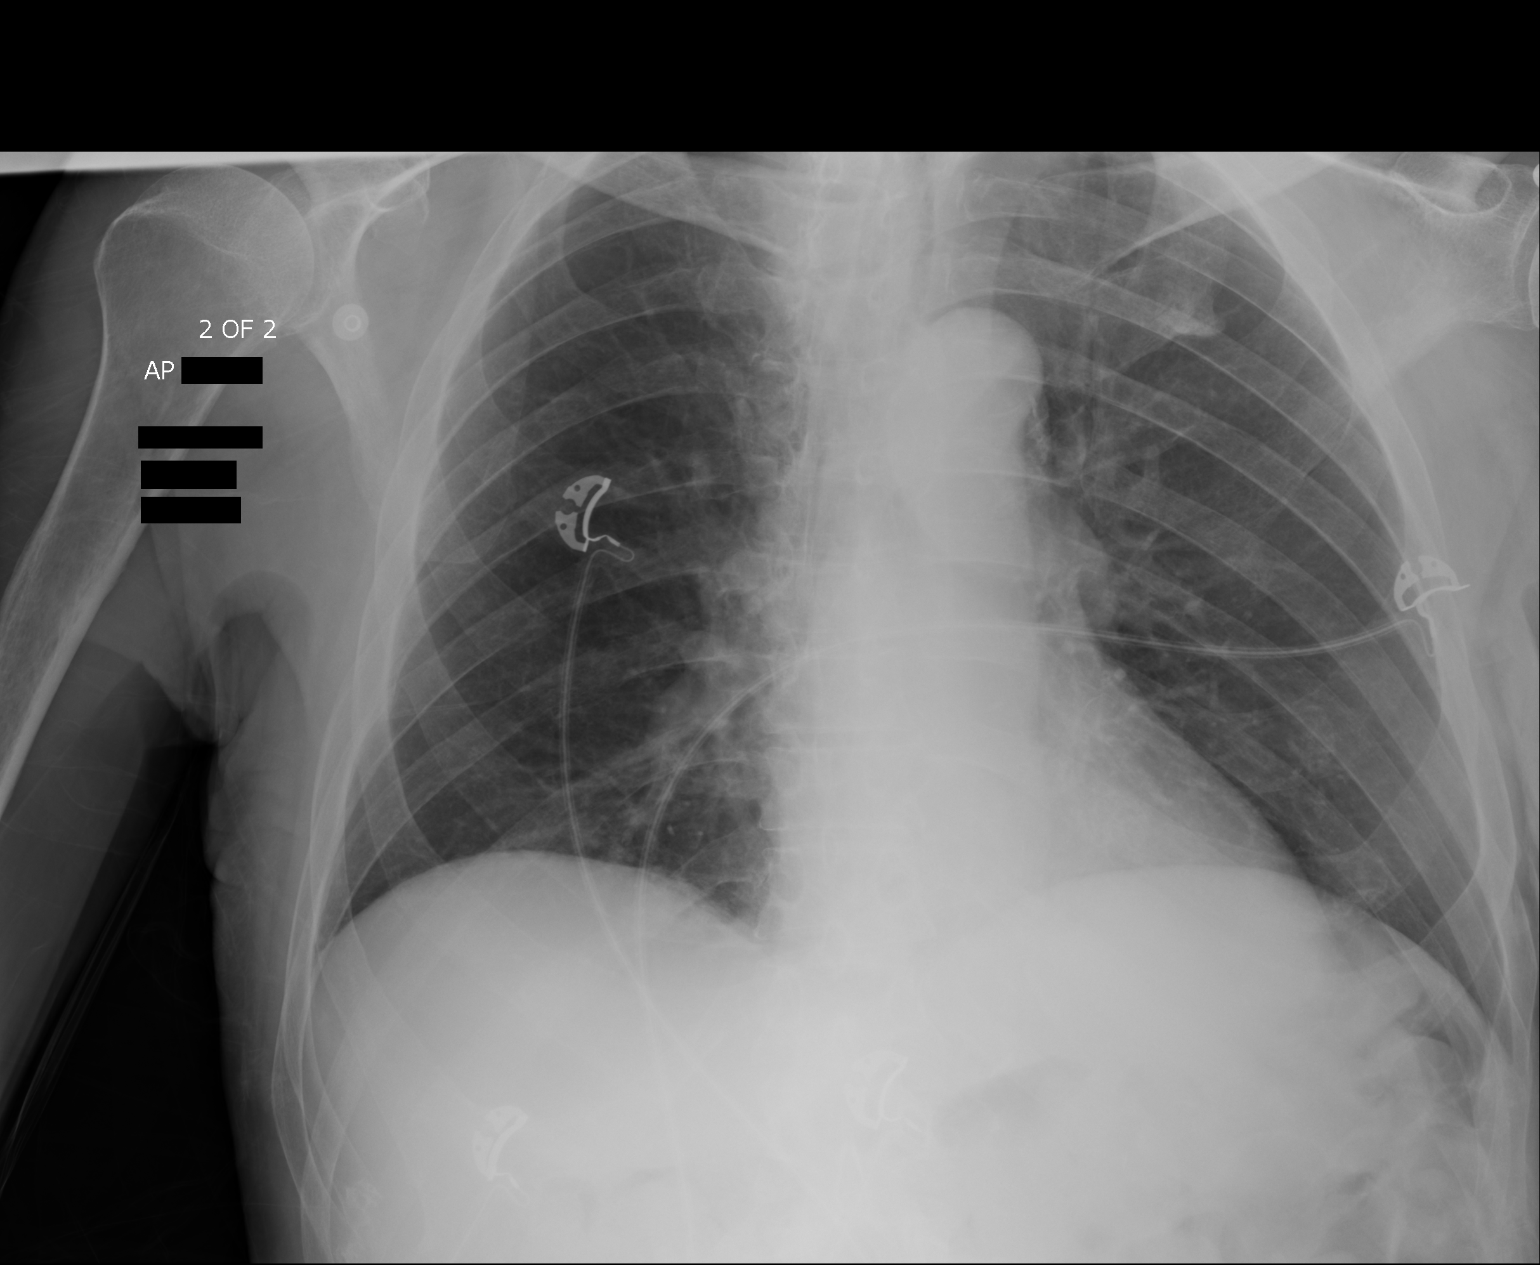

[2 of 2 positions shown; findings below may reference images not displayed]

FINDINGS: The heart size and mediastinal contours are within normal limits.
Both lungs are clear. The visualized skeletal structures are
unremarkable.
IMPRESSION: No active disease.
